# Patient Record
Sex: Female | Born: 1970 | Race: White | Hispanic: No | Marital: Single | Smoking: Never smoker
Health system: Southern US, Community
[De-identification: ages and names within clinical notes are randomized; demographics above are authoritative.]

## PROBLEM LIST (undated history)

## (undated) DIAGNOSIS — I491 Atrial premature depolarization: Secondary | ICD-10-CM

## (undated) DIAGNOSIS — K829 Disease of gallbladder, unspecified: Secondary | ICD-10-CM

## (undated) DIAGNOSIS — M1990 Unspecified osteoarthritis, unspecified site: Secondary | ICD-10-CM

## (undated) DIAGNOSIS — R7303 Prediabetes: Secondary | ICD-10-CM

## (undated) DIAGNOSIS — K219 Gastro-esophageal reflux disease without esophagitis: Secondary | ICD-10-CM

## (undated) DIAGNOSIS — M0520 Rheumatoid vasculitis with rheumatoid arthritis of unspecified site: Secondary | ICD-10-CM

## (undated) DIAGNOSIS — R002 Palpitations: Secondary | ICD-10-CM

## (undated) DIAGNOSIS — M2550 Pain in unspecified joint: Secondary | ICD-10-CM

## (undated) DIAGNOSIS — R600 Localized edema: Secondary | ICD-10-CM

## (undated) DIAGNOSIS — M069 Rheumatoid arthritis, unspecified: Secondary | ICD-10-CM

## (undated) DIAGNOSIS — R232 Flushing: Secondary | ICD-10-CM

## (undated) HISTORY — PX: OVARIAN CYST DRAINAGE: SHX325

## (undated) HISTORY — PX: BASAL CELL CARCINOMA EXCISION: SHX1214

## (undated) HISTORY — DX: Pain in unspecified joint: M25.50

## (undated) HISTORY — DX: Disease of gallbladder, unspecified: K82.9

## (undated) HISTORY — DX: Atrial premature depolarization: I49.1

## (undated) HISTORY — PX: CHOLECYSTECTOMY: SHX55

## (undated) HISTORY — DX: Unspecified osteoarthritis, unspecified site: M19.90

## (undated) HISTORY — DX: Rheumatoid arthritis, unspecified: M06.9

## (undated) HISTORY — DX: Gastro-esophageal reflux disease without esophagitis: K21.9

## (undated) HISTORY — DX: Palpitations: R00.2

## (undated) HISTORY — DX: Flushing: R23.2

## (undated) HISTORY — PX: SPINE SURGERY: SHX786

## (undated) HISTORY — DX: Localized edema: R60.0

## (undated) HISTORY — DX: Prediabetes: R73.03

---

## 2008-07-11 LAB — HM PAP SMEAR: HM Pap smear: NEGATIVE

## 2008-10-01 ENCOUNTER — Ambulatory Visit: Payer: Self-pay | Admitting: Unknown Physician Specialty

## 2014-11-01 DIAGNOSIS — R7303 Prediabetes: Secondary | ICD-10-CM

## 2014-11-01 HISTORY — PX: CERVICAL DISC SURGERY: SHX588

## 2014-11-01 HISTORY — DX: Prediabetes: R73.03

## 2015-09-11 ENCOUNTER — Other Ambulatory Visit: Payer: Self-pay

## 2015-09-11 DIAGNOSIS — Z299 Encounter for prophylactic measures, unspecified: Secondary | ICD-10-CM

## 2015-09-11 NOTE — Progress Notes (Signed)
Patient came in to have blood drawn per Althea GrimmerAlicia Copland, PA-C at [facility].  Pt wants final lab results faxed to [employer].

## 2015-09-12 LAB — COMPREHENSIVE METABOLIC PANEL
A/G RATIO: 1.6 (ref 1.1–2.5)
ALT: 21 IU/L (ref 0–32)
AST: 17 IU/L (ref 0–40)
Albumin: 4.4 g/dL (ref 3.5–5.5)
Alkaline Phosphatase: 78 IU/L (ref 39–117)
BILIRUBIN TOTAL: 0.3 mg/dL (ref 0.0–1.2)
BUN / CREAT RATIO: 14 (ref 9–23)
BUN: 12 mg/dL (ref 6–24)
CALCIUM: 9.8 mg/dL (ref 8.7–10.2)
CHLORIDE: 102 mmol/L (ref 97–106)
CO2: 23 mmol/L (ref 18–29)
Creatinine, Ser: 0.85 mg/dL (ref 0.57–1.00)
GFR, EST AFRICAN AMERICAN: 95 mL/min/{1.73_m2} (ref >59)
GFR, EST NON AFRICAN AMERICAN: 82 mL/min/{1.73_m2} (ref >59)
Globulin, Total: 2.7 g/dL (ref 1.5–4.5)
Glucose: 102 mg/dL — ABNORMAL HIGH (ref 65–99)
POTASSIUM: 4.5 mmol/L (ref 3.5–5.2)
Sodium: 140 mmol/L (ref 136–144)
Total Protein: 7.1 g/dL (ref 6.0–8.5)

## 2015-09-12 LAB — HGB A1C W/O EAG: Hgb A1c MFr Bld: 5.8 % — ABNORMAL HIGH (ref 4.8–5.6)

## 2015-09-15 ENCOUNTER — Encounter: Payer: Self-pay | Admitting: Emergency Medicine

## 2016-09-06 ENCOUNTER — Emergency Department
Admission: EM | Admit: 2016-09-06 | Discharge: 2016-09-07 | Disposition: A | Discharge status: Home / Self Care | Payer: Worker's Compensation | Attending: Emergency Medicine | Admitting: Emergency Medicine

## 2016-09-06 DIAGNOSIS — Y990 Civilian activity done for income or pay: Secondary | ICD-10-CM | POA: Diagnosis not present

## 2016-09-06 DIAGNOSIS — X500XXA Overexertion from strenuous movement or load, initial encounter: Secondary | ICD-10-CM | POA: Diagnosis not present

## 2016-09-06 DIAGNOSIS — Y929 Unspecified place or not applicable: Secondary | ICD-10-CM | POA: Diagnosis not present

## 2016-09-06 DIAGNOSIS — S8992XA Unspecified injury of left lower leg, initial encounter: Secondary | ICD-10-CM | POA: Diagnosis present

## 2016-09-06 DIAGNOSIS — S86112A Strain of other muscle(s) and tendon(s) of posterior muscle group at lower leg level, left leg, initial encounter: Secondary | ICD-10-CM | POA: Diagnosis not present

## 2016-09-06 DIAGNOSIS — Y9389 Activity, other specified: Secondary | ICD-10-CM | POA: Insufficient documentation

## 2016-09-06 NOTE — ED Triage Notes (Signed)
[facility], was Civil engineer, contractingpushing stretcher up a hill, heard something pop in L calf. Pt came up limping.

## 2016-09-06 NOTE — ED Notes (Signed)
Workman's Comp profile indicated UDS upon request. 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 Ineligibility criteria paperwork filled out.

## 2016-09-07 MED ORDER — IBUPROFEN 600 MG PO TABS
600.0000 mg | ORAL_TABLET | ORAL | Status: AC
  Administered 2016-09-07: 600 mg via ORAL
  Filled 2016-09-07: qty 1

## 2016-09-07 MED ORDER — HYDROCODONE-ACETAMINOPHEN 5-325 MG PO TABS: 1.0000 | ORAL_TABLET | Freq: Four times a day (QID) | ORAL | 0 refills | Status: DC | PRN

## 2016-09-07 NOTE — Discharge Instructions (Signed)
Please follow up closely with employee health through [facility] within 1-2 days, and also call to set up follow-up with Dr. Hyacinth MeekerMiller this week. I suspect you have a calf muscle tear, however he will need further evaluation to assure he do not also have an Achilles tendon injury. You may require an MRI or other evaluation to accurately diagnose the cause of today's injury.  Please utilize ACE wrap and crutches. Do no weight bearing on the left lower leg until cleared and followed-up with Dr. Hyacinth MeekerMiller.  Return to the emergency room right away if you develop severe pain, numbness tingling weakness or a cold blue or weak foot or other new concerns arise.

## 2016-09-07 NOTE — ED Provider Notes (Signed)
[facility] Emergency Department Provider Note   ____________________________________________   First MD Initiated Contact with Patient 09/06/16 2323     (approximate)  I have reviewed the triage vital signs and the nursing notes.   HISTORY  Chief Complaint Left calf pain   HPI Carly Stanley is a 45 y.o. female who was working pushing a Doctor, general practice up a steep embankment when she felt a sudden severe pain in the left medial portion of her calf. Since that time she has only been able to bear slight amount await, and has significant pain with any movement of the foot especially, as she demonstrates dorsiflexing it.  No pain at the base of the left foot, in the toes, or at the ankle, pain rest medially along the mid calf. Severe when attempting to bear weight.  No nausea vomiting or fever.  Reports healthy, previous history of a cervical surgery in the distant past  No past medical history on file.  There are no active problems to display for this patient.   No past surgical history on file.  Prior to Admission medications   Medication Sig Start Date End Date Taking? Authorizing Provider  HYDROcodone-acetaminophen (NORCO/VICODIN) 5-325 MG tablet Take 1 tablet by mouth every 6 (six) hours as needed for moderate pain or severe pain. 09/07/16   Sharyn Creamer, MD    Allergies Patient has no known allergies.  No family history on file.  Social History Social History  Substance Use Topics  . Smoking status: Not on file  . Smokeless tobacco: Not on file  . Alcohol use Not on file    Review of Systems  There was no fall. No trauma aside from sudden onset while pushing stretcher area and no pain in the knee or thigh  Cardiovascular: Denies chest pain. Respiratory: Denies shortness of breath. Gastrointestinal: No abdominal pain.  No nausea, no vomiting.  No diarrhea.  No constipation. Musculoskeletal: Negative for back pain. See history of present  illness Skin: Negative for rash. Neurological: Negative for headaches, focal weakness or numbness.   ____________________________________________   PHYSICAL EXAM:  VITAL SIGNS: ED Triage Vitals  Enc Vitals Group     BP 09/06/16 2335 117/79     Pulse Rate 09/06/16 2335 80     Resp 09/06/16 2335 18     Temp 09/06/16 2335 98.3 F (36.8 C)     Temp Source 09/06/16 2335 Oral     SpO2 09/06/16 2335 97 %     Weight --      Height --      Head Circumference --      Peak Flow --      Pain Score 09/06/16 2342 5     Pain Loc --      Pain Edu? --      Excl. in GC? --     Constitutional: Alert and oriented. Well appearing and in no acute distressResting with her left leg elevated. Eyes: Conjunctivae are normal. PERRL. EOMI. Head: Atraumatic. Nose: No congestion/rhinnorhea. Mouth/Throat: Mucous membranes are moist.  Cardiovascular: Normal rate, regular rhythm. Strong dorsalis pedis and posterior tibial pulse on the left Respiratory: Normal respiratory effort.  No retractions. Speaks in full clear sentences Gastrointestinal: No distention.  Musculoskeletal: Lower Extremities  No edema. Normal DP/PT pulses bilateral with good cap refill.  Normal neuro-motor function lower extremities bilateral.  RIGHT Right lower extremity demonstrates normal strength, good use of all muscles. No edema bruising or contusions of the right hip, right  knee, right ankle. Full range of motion of the right lower extremity without pain. No pain on axial loading. No evidence of trauma.  LEFT Left lower extremity demonstrates normal strength except for limitation especially with dorsi and plantar flexion at the foot due to pain in the medial left gastrocnemius, good use of all muscles. No edema bruising or contusions of the hip,  knee, ankle though there is slight edema and tenderness over the left medial gastrocnemius.  No pain on axial loading. No evidence of deformity. Unable to perform [facility] test due  to pain, but there is no tenderness at the insertion of the Achilles or the distal third of the posterior left leg. Patient is able to dorsi and plantarflex at the foot, but dorsiflexing in particular causes pain over the medial left calf. Normal capillary refill and sensation over all digits of the foot.  Neurologic:  Normal speech and language. No gross focal neurologic deficits are appreciated.  Skin:  Skin is warm, dry and intact. No rash noted. Psychiatric: Mood and affect are normal. Speech and behavior are normal.  ____________________________________________   LABS (all labs ordered are listed, but only abnormal results are displayed)  Labs Reviewed - No data to display ____________________________________________  EKG   ____________________________________________  RADIOLOGY   ____________________________________________   PROCEDURES  Procedure(s) performed: None  Procedures  Critical Care performed: No  ____________________________________________   INITIAL IMPRESSION / ASSESSMENT AND PLAN / ED COURSE  Pertinent labs & imaging results that were available during my care of the patient were reviewed by me and considered in my medical decision making (see chart for details).  Sudden onset left thigh pain under loading. Pain isolates primarily to the medial left calf, and clinical exam appears most consistent with likely gastrocnemius tear or potentially Achilles type injury. Neurovascular intact. Clinical exam and history do not fit that of an infectious etiology or that of a acute thrombotic or embolic disease. Discussed with the patient, we will treat her conservatively with crutches, nonweightbearing, Ace wrap," rice" therapy. She has access to and has seen Dr. Hyacinth MeekerMiller in the past and will follow-up closely with orthopedics. Patient understands that further workup likely involves obtaining an MRI, close follow-up with orthopedics this week for further diagnostic  testing as I cannot exclude muscle body rupture, complete tear, or Achilles injury at this time  I will prescribe the patient a narcotic pain (hydorocodone) medicine due to their condition which I anticipate will cause at least moderate pain short term. I discussed with the patient safe use of narcotic pain medicines, and that they are not to drive, work in dangerous areas, or ever take more than prescribed (no more than 1 pill every 6 hours). We discussed that this is the type of medication that can be  overdosed on and the risks of this type of medicine. Patient is very agreeable to only use as prescribed and to never use more than prescribed.   Clinical Course      ____________________________________________   FINAL CLINICAL IMPRESSION(S) / ED DIAGNOSES  Final diagnoses:  Gastrocnemius tear, left, initial encounter      NEW MEDICATIONS STARTED DURING THIS VISIT:  Discharge Medication List as of 09/07/2016 12:35 AM    START taking these medications   Details  HYDROcodone-acetaminophen (NORCO/VICODIN) 5-325 MG tablet Take 1 tablet by mouth every 6 (six) hours as needed for moderate pain or severe pain., Starting Tue 09/07/2016, Print         Note:  This document  was prepared using Conservation officer, historic buildingsDragon voice recognition software and may include unintentional dictation errors.     Sharyn CreamerMark Vertis Scheib, MD 09/07/16 [phone]

## 2016-09-20 ENCOUNTER — Other Ambulatory Visit: Payer: Self-pay

## 2016-09-20 DIAGNOSIS — R7309 Other abnormal glucose: Secondary | ICD-10-CM

## 2016-09-20 DIAGNOSIS — Z0000 Encounter for general adult medical examination without abnormal findings: Secondary | ICD-10-CM

## 2016-09-20 NOTE — Progress Notes (Signed)
Patient in office today for lab work only per [facility] drawn by Toniann FailWendy

## 2016-09-21 LAB — LIPID PANEL
CHOLESTEROL TOTAL: 157 mg/dL (ref 100–199)
Chol/HDL Ratio: 2.5 ratio units (ref 0.0–4.4)
HDL: 63 mg/dL (ref >39)
LDL Calculated: 83 mg/dL (ref 0–99)
Triglycerides: 54 mg/dL (ref 0–149)
VLDL CHOLESTEROL CAL: 11 mg/dL (ref 5–40)

## 2016-09-21 LAB — COMPREHENSIVE METABOLIC PANEL
ALBUMIN: 4.7 g/dL (ref 3.5–5.5)
ALT: 20 IU/L (ref 0–32)
AST: 20 IU/L (ref 0–40)
Albumin/Globulin Ratio: 1.6 (ref 1.2–2.2)
Alkaline Phosphatase: 84 IU/L (ref 39–117)
BUN / CREAT RATIO: 15 (ref 9–23)
BUN: 13 mg/dL (ref 6–24)
Bilirubin Total: 0.3 mg/dL (ref 0.0–1.2)
CALCIUM: 10 mg/dL (ref 8.7–10.2)
CO2: 18 mmol/L (ref 18–29)
CREATININE: 0.88 mg/dL (ref 0.57–1.00)
Chloride: 100 mmol/L (ref 96–106)
GFR, EST AFRICAN AMERICAN: 90 mL/min/{1.73_m2} (ref >59)
GFR, EST NON AFRICAN AMERICAN: 78 mL/min/{1.73_m2} (ref >59)
GLOBULIN, TOTAL: 3 g/dL (ref 1.5–4.5)
Glucose: 92 mg/dL (ref 65–99)
Potassium: 5.2 mmol/L (ref 3.5–5.2)
SODIUM: 139 mmol/L (ref 134–144)
TOTAL PROTEIN: 7.7 g/dL (ref 6.0–8.5)

## 2016-09-21 LAB — VITAMIN D 25 HYDROXY (VIT D DEFICIENCY, FRACTURES): VIT D 25 HYDROXY: 36.6 ng/mL (ref 30.0–100.0)

## 2016-09-21 LAB — HGB A1C W/O EAG: Hgb A1c MFr Bld: 5.5 % (ref 4.8–5.6)

## 2016-09-22 ENCOUNTER — Other Ambulatory Visit: Payer: Self-pay | Admitting: Obstetrics and Gynecology

## 2016-09-22 DIAGNOSIS — Z1231 Encounter for screening mammogram for malignant neoplasm of breast: Secondary | ICD-10-CM

## 2016-10-06 ENCOUNTER — Ambulatory Visit
Admission: RE | Admit: 2016-10-06 | Discharge: 2016-10-06 | Disposition: A | Discharge status: Home / Self Care | Payer: Managed Care, Other (non HMO) | Source: Ambulatory Visit | Attending: Obstetrics and Gynecology | Admitting: Obstetrics and Gynecology

## 2016-10-06 DIAGNOSIS — Z1231 Encounter for screening mammogram for malignant neoplasm of breast: Secondary | ICD-10-CM | POA: Diagnosis not present

## 2016-10-12 ENCOUNTER — Other Ambulatory Visit: Payer: Self-pay | Admitting: *Deleted

## 2016-10-12 ENCOUNTER — Encounter: Payer: Self-pay | Admitting: Physician Assistant

## 2016-10-12 ENCOUNTER — Ambulatory Visit: Payer: Self-pay | Admitting: Physician Assistant

## 2016-10-12 ENCOUNTER — Inpatient Hospital Stay
Admission: RE | Admit: 2016-10-12 | Discharge: 2016-10-12 | Disposition: A | Discharge status: Home / Self Care | Payer: Self-pay | Source: Ambulatory Visit | Attending: *Deleted | Admitting: *Deleted

## 2016-10-12 VITALS — BP 120/70 | HR 96 | Temp 98.6°F

## 2016-10-12 DIAGNOSIS — Z9289 Personal history of other medical treatment: Secondary | ICD-10-CM

## 2016-10-12 DIAGNOSIS — J069 Acute upper respiratory infection, unspecified: Secondary | ICD-10-CM

## 2016-10-12 MED ORDER — AMOXICILLIN 875 MG PO TABS: 875.0000 mg | ORAL_TABLET | Freq: Two times a day (BID) | ORAL | 0 refills | Status: DC

## 2016-10-12 MED ORDER — HYDROCOD POLST-CPM POLST ER 10-8 MG/5ML PO SUER: 5.0000 mL | Freq: Two times a day (BID) | ORAL | 0 refills | Status: DC | PRN

## 2016-10-12 MED ORDER — METHYLPREDNISOLONE 4 MG PO TBPK: ORAL_TABLET | ORAL | 0 refills | Status: DC

## 2016-10-12 NOTE — Progress Notes (Signed)
S: C/o cough and congestion for 5 days, no fever, chills, cp/sob, v/d; mucus was green this am but clear throughout the day, cough is sporadic, keeping her awake at night, felt bad a couple of weeks ago, started getting better but now the sx are worse Using otc meds: robitussin  O: PE: vitals wnl, nad, perrl eomi, normocephalic, tms dull, nasal mucosa red and swollen, throat injected, neck supple no lymph, lungs c t a, cv rrr, neuro intact  A:  Acute uri   P: drink fluids, continue regular meds , use otc meds of choice, return if not improving in 5 days, return earlier if worsening, amoxil, tussionex, medrol dose pack

## 2017-01-12 ENCOUNTER — Other Ambulatory Visit: Payer: Self-pay

## 2017-01-12 VITALS — BP 130/90 | Ht 62.0 in | Wt 180.0 lb

## 2017-01-12 DIAGNOSIS — Z008 Encounter for other general examination: Secondary | ICD-10-CM

## 2017-01-12 DIAGNOSIS — Z0189 Encounter for other specified special examinations: Principal | ICD-10-CM

## 2017-01-12 NOTE — Progress Notes (Signed)
Patient came in to have her biometric screening done.  Patient expressed that she had her physical done with her own physician.

## 2017-09-09 ENCOUNTER — Telehealth: Payer: Self-pay

## 2017-09-09 NOTE — Telephone Encounter (Signed)
Pt has appt on the 28th, works for [employer] and [facility].  Would like order for blood work to be written and [facility] on [address]  [phone]

## 2017-09-12 NOTE — Telephone Encounter (Signed)
Pls fax Thx 

## 2017-09-28 ENCOUNTER — Ambulatory Visit (INDEPENDENT_AMBULATORY_CARE_PROVIDER_SITE_OTHER): Payer: Managed Care, Other (non HMO) | Admitting: Obstetrics and Gynecology

## 2017-09-28 ENCOUNTER — Encounter: Payer: Self-pay | Admitting: Obstetrics and Gynecology

## 2017-09-28 ENCOUNTER — Other Ambulatory Visit: Payer: Self-pay

## 2017-09-28 VITALS — BP 118/80 | HR 76 | Ht 62.0 in | Wt 164.0 lb

## 2017-09-28 DIAGNOSIS — N3946 Mixed incontinence: Secondary | ICD-10-CM

## 2017-09-28 DIAGNOSIS — Z1239 Encounter for other screening for malignant neoplasm of breast: Secondary | ICD-10-CM

## 2017-09-28 DIAGNOSIS — N3281 Overactive bladder: Secondary | ICD-10-CM

## 2017-09-28 DIAGNOSIS — Z131 Encounter for screening for diabetes mellitus: Secondary | ICD-10-CM | POA: Diagnosis not present

## 2017-09-28 DIAGNOSIS — Z01419 Encounter for gynecological examination (general) (routine) without abnormal findings: Secondary | ICD-10-CM

## 2017-09-28 DIAGNOSIS — Z13220 Encounter for screening for lipoid disorders: Secondary | ICD-10-CM | POA: Diagnosis not present

## 2017-09-28 DIAGNOSIS — Z0000 Encounter for general adult medical examination without abnormal findings: Secondary | ICD-10-CM | POA: Diagnosis not present

## 2017-09-28 DIAGNOSIS — Z1231 Encounter for screening mammogram for malignant neoplasm of breast: Secondary | ICD-10-CM | POA: Diagnosis not present

## 2017-09-28 NOTE — Progress Notes (Signed)
PCP:  Patient, No Pcp Per   Chief Complaint  Patient presents with  . Gynecologic Exam    No Complaints     HPI:      Ms. Carly Stanley is a 46 y.o. No obstetric history on file. who LMP was Patient's last menstrual period was 09/15/2017., presents today for her annual examination.  Her menses are regular every 28-30 days, lasting 5 days.  Dysmenorrhea mild. She does not have intermenstrual bleeding. She has menstrual HAs.  Sex activity: single partner, declines BC.  Last Pap: September 17, 2015  Results were: no abnormalities /neg HPV DNA  Hx of STDs: none  Last mammogram: October 06, 2016  Results were: normal--routine follow-up in 12 months There is a FH of breast cancer in her mom, genetic testing not indicated. There is no FH of ovarian cancer. The patient does do self-breast exams.  Tobacco use: The patient denies current or previous tobacco use. Alcohol use: none No drug use.  Exercise: not active  She does get adequate calcium and Vitamin D in her diet.  She had pre-DM on labs 2016 but normal last yr. Due for fasting labs for insurance benefits. 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  Pt has noted OAB/urge incont and SUI sx. She drinks caffeine daily.    Past Medical History:  Diagnosis Date  . Pre-diabetes 2016    Past Surgical History:  Procedure Laterality Date  . CERVICAL DISC SURGERY  2016  . CHOLECYSTECTOMY    . OVARIAN CYST DRAINAGE      Family History  Problem Relation Age of Onset  . Breast cancer Mother 6470  . Hyperlipidemia Mother   . Hypertension Mother   . Hypertension Father   . Diabetes Brother   . Cancer Maternal Grandmother        Sinus  . Lung cancer Paternal Grandfather [id number]    Social History   Socioeconomic History  . Marital status: Single    Spouse name: Not on file  . Number of children: Not on file  . Years of education: Not on file  . Highest education level: Not on file  Social Needs  . Financial resource strain: Not on  file  . Food insecurity - worry: Not on file  . Food insecurity - inability: Not on file  . Transportation needs - medical: Not on file  . Transportation needs - non-medical: Not on file  Occupational History  . Not on file  Tobacco Use  . Smoking status: Never Smoker  . Smokeless tobacco: Never Used  Substance and Sexual Activity  . Alcohol use: No    Frequency: Never  . Drug use: No  . Sexual activity: Yes  Other Topics Concern  . Not on file  Social History Narrative  . Not on file    No outpatient medications have been marked as taking for the 09/28/17 encounter (Office Visit) with Copland, Ilona SorrelAlicia B, PA-C.     ROS:  Review of Systems  Constitutional: Negative for fatigue, fever and unexpected weight change.  Respiratory: Negative for cough, shortness of breath and wheezing.   Cardiovascular: Negative for chest pain, palpitations and leg swelling.  Gastrointestinal: Negative for blood in stool, constipation, diarrhea, nausea and vomiting.  Endocrine: Negative for cold intolerance, heat intolerance and polyuria.  Genitourinary: Positive for urgency. Negative for dyspareunia, dysuria, flank pain, frequency, genital sores, hematuria, menstrual problem, pelvic pain, vaginal bleeding, vaginal discharge and vaginal pain.  Musculoskeletal: Negative for back pain, joint swelling and  myalgias.  Skin: Negative for rash.  Neurological: Negative for dizziness, syncope, light-headedness, numbness and headaches.  Hematological: Negative for adenopathy.  Psychiatric/Behavioral: Negative for agitation, confusion, sleep disturbance and suicidal ideas. The patient is not nervous/anxious.      Objective: BP 118/80 (BP Location: Left Arm, Patient Position: Sitting, Cuff Size: Normal)   Pulse 76   Ht  (1.575 m)   Wt 164 lb (74.4 kg)   LMP 09/15/2017   BMI 30.00 kg/m    Physical Exam  Constitutional: She is oriented to person, place, and time. She appears well-developed and  well-nourished.  Genitourinary: Vagina normal and uterus normal. There is no rash or tenderness on the right labia. There is no rash or tenderness on the left labia. No erythema or tenderness in the vagina. No vaginal discharge found. Right adnexum does not display mass and does not display tenderness. Left adnexum does not display mass and does not display tenderness. Cervix does not exhibit motion tenderness or polyp. Uterus is not enlarged or tender.  Neck: Normal range of motion. No thyromegaly present.  Cardiovascular: Normal rate, regular rhythm and normal heart sounds.  No murmur heard. Pulmonary/Chest: Effort normal and breath sounds normal. Right breast exhibits no mass, no nipple discharge, no skin change and no tenderness. Left breast exhibits no mass, no nipple discharge, no skin change and no tenderness.  Abdominal: Soft. There is no tenderness. There is no guarding.  Musculoskeletal: Normal range of motion.  Neurological: She is alert and oriented to person, place, and time. No cranial nerve deficit.  Psychiatric: She has a normal mood and affect. Her behavior is normal.  Vitals reviewed.   Assessment/Plan: Encounter for annual routine gynecological examination  Screening for breast cancer - Pt to sched mammo. - Plan: MM DIGITAL SCREENING BILATERAL  Blood tests for routine general physical examination - Will do at employee health. Will f/u wiht results.  - Plan: Comprehensive metabolic panel, Lipid panel, Hemoglobin A1c  Screening cholesterol level - Plan: Lipid panel  Screening for diabetes mellitus - Plan: Hemoglobin A1c  OAB (overactive bladder) - D/C caffeine/void frequently.  Mixed incontinence  GYN counsel breast self exam, mammography screening, adequate intake of calcium and vitamin D, diet and exercise     F/U  Return in about 1 year (around 09/28/2018).  Alicia B. Copland, PA-C 09/28/2017 2:40 PM

## 2017-09-28 NOTE — Patient Instructions (Signed)
I value your feedback and entrusting us with your care. If you get a [facility] patient survey, I would appreciate you taking the time to let us know about your experience today. Thank you! 

## 2017-10-14 ENCOUNTER — Other Ambulatory Visit: Payer: Worker's Compensation

## 2017-10-14 DIAGNOSIS — Z0000 Encounter for general adult medical examination without abnormal findings: Secondary | ICD-10-CM

## 2017-10-14 NOTE — Progress Notes (Signed)
Patient in office today for labwork only

## 2017-10-15 LAB — COMPREHENSIVE METABOLIC PANEL
A/G RATIO: 1.7 (ref 1.2–2.2)
ALBUMIN: 4.1 g/dL (ref 3.5–5.5)
ALK PHOS: 64 IU/L (ref 39–117)
ALT: 14 IU/L (ref 0–32)
AST: 15 IU/L (ref 0–40)
BILIRUBIN TOTAL: 0.3 mg/dL (ref 0.0–1.2)
BUN / CREAT RATIO: 12 (ref 9–23)
BUN: 11 mg/dL (ref 6–24)
CHLORIDE: 105 mmol/L (ref 96–106)
CO2: 23 mmol/L (ref 20–29)
CREATININE: 0.91 mg/dL (ref 0.57–1.00)
Calcium: 8.9 mg/dL (ref 8.7–10.2)
GFR calc Af Amer: 86 mL/min/{1.73_m2} (ref >59)
GFR calc non Af Amer: 75 mL/min/{1.73_m2} (ref >59)
GLOBULIN, TOTAL: 2.4 g/dL (ref 1.5–4.5)
Glucose: 78 mg/dL (ref 65–99)
POTASSIUM: 4.6 mmol/L (ref 3.5–5.2)
SODIUM: 142 mmol/L (ref 134–144)
Total Protein: 6.5 g/dL (ref 6.0–8.5)

## 2017-10-15 LAB — HGB A1C W/O EAG: Hgb A1c MFr Bld: 5.5 % (ref 4.8–5.6)

## 2017-10-15 LAB — LIPID PANEL
CHOLESTEROL TOTAL: 146 mg/dL (ref 100–199)
Chol/HDL Ratio: 2.2 ratio (ref 0.0–4.4)
HDL: 65 mg/dL (ref >39)
LDL Calculated: 74 mg/dL (ref 0–99)
TRIGLYCERIDES: 35 mg/dL (ref 0–149)
VLDL Cholesterol Cal: 7 mg/dL (ref 5–40)

## 2017-10-17 NOTE — Progress Notes (Signed)
Lab results were faxed to Althea GrimmerAlicia Copland at [facility] per patient's request.

## 2017-10-18 ENCOUNTER — Telehealth: Payer: Self-pay

## 2017-10-18 NOTE — Telephone Encounter (Signed)
-----   Message from Maple Hudsonichard L Gilbert Jr., MD sent at 10/17/2017  4:00 PM EST ----- Labs OK.

## 2017-10-18 NOTE — Telephone Encounter (Signed)
Lmtcb. Patient advised to check labs on mychart and call back with any questions or concerns. sd

## 2017-10-26 ENCOUNTER — Ambulatory Visit
Admission: RE | Admit: 2017-10-26 | Discharge: 2017-10-26 | Disposition: A | Discharge status: Home / Self Care | Payer: Managed Care, Other (non HMO) | Source: Ambulatory Visit | Attending: Obstetrics and Gynecology | Admitting: Obstetrics and Gynecology

## 2017-10-26 DIAGNOSIS — Z1231 Encounter for screening mammogram for malignant neoplasm of breast: Secondary | ICD-10-CM | POA: Insufficient documentation

## 2017-10-26 DIAGNOSIS — Z1239 Encounter for other screening for malignant neoplasm of breast: Secondary | ICD-10-CM

## 2017-10-27 ENCOUNTER — Encounter: Payer: Self-pay | Admitting: Obstetrics and Gynecology

## 2017-11-04 ENCOUNTER — Ambulatory Visit: Payer: Self-pay | Admitting: Emergency Medicine

## 2017-11-04 ENCOUNTER — Encounter: Payer: Self-pay | Admitting: Emergency Medicine

## 2017-11-04 VITALS — BP 124/90 | HR 74 | Temp 97.8°F

## 2017-11-04 DIAGNOSIS — J069 Acute upper respiratory infection, unspecified: Secondary | ICD-10-CM

## 2017-11-04 DIAGNOSIS — J0101 Acute recurrent maxillary sinusitis: Secondary | ICD-10-CM

## 2017-11-04 MED ORDER — AMOXICILLIN 875 MG PO TABS: 875.0000 mg | ORAL_TABLET | Freq: Two times a day (BID) | ORAL | 0 refills | Status: DC

## 2017-11-04 MED ORDER — FLUTICASONE PROPIONATE 50 MCG/ACT NA SUSP: 2.0000 | Freq: Every day | NASAL | 6 refills | Status: DC

## 2017-11-04 NOTE — Progress Notes (Signed)
Subjective. Patient enters with onset 1 week ago of head congestion and fullness in the ears and chills. She has not had any documented fever with this. She has tried over-the-counter Alka-Seltzer but does not feel any improvement.  Review of systems.  Patient in good health except for overactive bladder symptoms. She is a nonsmoker. Objective. Pupils equal reactive to light. TMs with fluid. Nose congested. Throat slightly red. Chest clear to both auscultation and percussion. Assessment. Patient with persistent upper respiratory infection now with signs of sinusitis. Plan. Flonase. Amoxicillin 875 twice a day.

## 2017-11-29 ENCOUNTER — Ambulatory Visit: Payer: Managed Care, Other (non HMO) | Admitting: Cardiovascular Disease

## 2017-11-29 ENCOUNTER — Encounter: Payer: Self-pay | Admitting: Cardiovascular Disease

## 2017-11-29 VITALS — BP 132/82 | HR 68 | Ht 62.0 in | Wt 175.0 lb

## 2017-11-29 DIAGNOSIS — I491 Atrial premature depolarization: Secondary | ICD-10-CM | POA: Diagnosis not present

## 2017-11-29 DIAGNOSIS — R0602 Shortness of breath: Secondary | ICD-10-CM | POA: Insufficient documentation

## 2017-11-29 DIAGNOSIS — R002 Palpitations: Secondary | ICD-10-CM

## 2017-11-29 MED ORDER — PROPRANOLOL HCL 20 MG PO TABS: 20.0000 mg | ORAL_TABLET | Freq: Three times a day (TID) | ORAL | 3 refills | Status: DC | PRN

## 2017-11-29 MED ORDER — NEBIVOLOL HCL 5 MG PO TABS: 5.0000 mg | ORAL_TABLET | Freq: Every day | ORAL | 3 refills | Status: DC

## 2017-11-29 NOTE — Progress Notes (Signed)
Cardiology Office Note  Date:  11/29/2017   ID:  Carly Stanley Borgen, DOB 02-22-1971, MRN [id number]  PCP:  Patient, No Pcp Per   Chief Complaint  Patient presents with  . Other    Patient c/o SOB and heart fluttering. Meds reviewed verbally with patient.     HPI:  Ms. Carly Stanley Petteway is a 47 year old woman with past medical history of Palpitations Prediabetes Who presents for evaluation of her abnormal heart rhythm  Reports that over the past 6 months or so she has had waxing waning palpitations Initially was more rare, becoming more frequent Now presenting daily Feels palpitations up into her throat  She was at work one day and put a monitor on Telemetry strip reviewed with her in the office today showing normal sinus rhythm with APCs in a bigeminal pattern  She continues to have similar symptoms, does not seem to change Sometimes seems to affect her breathing but not significant Does not wake her up at night But she does have symptoms into the evening as well as day Does not seem to be related to her activity level  EKG personally reviewed by myself on todays visit Shows normal sinus rhythm with rate 68 bpm APC noted  As above telemetry strip reviewed with her today showing APCs in a bigeminal pattern   PMH:   has a past medical history of Pre-diabetes (2016).  PSH:    Past Surgical History:  Procedure Laterality Date  . CERVICAL DISC SURGERY  2016  . CHOLECYSTECTOMY    . OVARIAN CYST DRAINAGE      Current Outpatient Medications  Medication Sig Dispense Refill  . nebivolol (BYSTOLIC) 5 MG tablet Take 1 tablet (5 mg total) by mouth daily. 90 tablet 3  . propranolol (INDERAL) 20 MG tablet Take 1 tablet (20 mg total) by mouth 3 (three) times daily as needed. 90 tablet 3   No current facility-administered medications for this visit.      Allergies:   Patient has no known allergies.   Social History:  The patient  reports that  has never smoked. she has never used  smokeless tobacco. She reports that she does not drink alcohol or use drugs.   Family History:   family history includes Breast cancer (age of onset: 3070) in her mother; Cancer in her maternal grandmother; Diabetes in her brother; Hyperlipidemia in her mother; Hypertension in her father and mother; Lung cancer (age of onset: 4865) in her paternal grandfather.    Review of Systems: Review of Systems  Constitutional: Negative.   Respiratory: Negative.   Cardiovascular: Positive for palpitations.  Gastrointestinal: Negative.   Musculoskeletal: Negative.   Neurological: Negative.   Psychiatric/Behavioral: Negative.   All other systems reviewed and are negative.    PHYSICAL EXAM: VS:  BP 132/82 (BP Location: Left Arm, Patient Position: Sitting, Cuff Size: Normal)   Pulse 68   Ht 5\' 2"  (1.575 m)   Wt 175 lb (79.4 kg)   BMI 32.01 kg/m  , BMI Body mass index is 32.01 kg/m. GEN: Well nourished, well developed, in no acute distress  HEENT: normal  Neck: no JVD, carotid bruits, or masses Cardiac: RRR; no murmurs, rubs, or gallops,no edema  Ectopy appreciated Respiratory:  clear to auscultation bilaterally, normal work of breathing GI: soft, nontender, nondistended, + BS MS: no deformity or atrophy  Skin: warm and dry, no rash Neuro:  Strength and sensation are intact Psych: euthymic mood, full affect    Recent Labs: 10/14/2017: ALT 14;  BUN 11; Creatinine, Ser 0.91; Potassium 4.6; Sodium 142    Lipid Panel Lab Results  Component Value Date   CHOL 146 10/14/2017   HDL 65 10/14/2017   LDLCALC 74 10/14/2017   TRIG 35 10/14/2017      Wt Readings from Last 3 Encounters:  11/29/17 175 lb (79.4 kg)  09/28/17 164 lb (74.4 kg)  01/12/17 180 lb (81.6 kg)       ASSESSMENT AND PLAN:  Shortness of breath - Plan: EKG 12-Lead Likely secondary to palpitations below, minimal symptoms Has not slow down her exercise tolerance Normal EKG otherwise with normal clinical exam, no further  workup at this time  Palpitations She performed a telemetry strip while at work.  Works for EMS This showed APCs in a bigeminal pattern Long discussion concerning mechanism, management She is becoming more symptomatic Denies any recent stressors, excess caffeine, cold medication etc. Less likely structural heart disease Recommend she try Bystolic 5 mg daily For breakthrough applications suggest she try propranolol 10 up to 20 mg as needed  Frequent PACs Seen on recent telemetry strip Likely contributing to her symptoms Even appreciated on exam today Management as above   Total encounter time more than 45 minutes  Greater than 50% was spent in counseling and coordination of care with the patient   Disposition:   F/U as needed   Orders Placed This Encounter  Procedures  . EKG 12-Lead     Signed, Dossie Arbour, M.D., Ph.D. 11/29/2017  [facility] [place], [place] [phone]

## 2017-11-29 NOTE — Patient Instructions (Addendum)
Medication Instructions:   Consider taking bystolic 2.5  Up to 10 mg daily as needed for PACs Premature atrial contractions 24 hour Medication Samples have been provided to the patient.  Drug name: Bystolic       Strength: 5 mg        Qty: 2 boxes  LOT: [id number]  Exp.Date: 12/20   Take propranolol as needed (4 to 6 hour) 10 mg up to 20 mg up to three times a day  Labwork:  No new labs needed  Testing/Procedures:  No further testing at this time   Follow-Up: It was a pleasure seeing you in the office today. Please call us if you have new issues that need to be addressed before your next appt.  [phone]  Your physician wants you to follow-up in:  As needed  If you need a refill on your cardiac medications before your next appointment, please call your pharmacy.

## 2017-12-06 ENCOUNTER — Ambulatory Visit: Payer: Self-pay | Admitting: Cardiovascular Disease

## 2018-05-16 ENCOUNTER — Ambulatory Visit: Payer: Managed Care, Other (non HMO) | Admitting: Physician Assistant

## 2018-05-16 ENCOUNTER — Encounter: Payer: Self-pay | Admitting: Physician Assistant

## 2018-05-16 VITALS — BP 112/80 | HR 71 | Temp 98.2°F | Resp 16 | Ht 62.0 in | Wt 183.0 lb

## 2018-05-16 DIAGNOSIS — Z6833 Body mass index (BMI) 33.0-33.9, adult: Secondary | ICD-10-CM | POA: Diagnosis not present

## 2018-05-16 DIAGNOSIS — R002 Palpitations: Secondary | ICD-10-CM | POA: Diagnosis not present

## 2018-05-16 DIAGNOSIS — I491 Atrial premature depolarization: Secondary | ICD-10-CM | POA: Diagnosis not present

## 2018-05-16 DIAGNOSIS — R0602 Shortness of breath: Secondary | ICD-10-CM | POA: Diagnosis not present

## 2018-05-16 NOTE — Progress Notes (Signed)
Patient: Carly Stanley, Female    DOB: 1971/09/09, 47 y.o.   MRN: [id number] Visit Date: 05/16/2018  Today's Provider: Margaretann Loveless, PA-C   Chief Complaint  Patient presents with  . New Patient (Initial Visit)   Subjective:    New Patient Carly Stanley is a 47 y.o. female who presents today as a new patient to establish care. She feels well. 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 She reports she is sleeping poorly. Possibly secondary to shift work.   Her PMH includes PAC's. This was being F/B Dr. Mariah Milling. She is taking propanolol 20 mg po qd and bystolic 5 mg po qd for. She feels this is worsening. She would like to discuss increasing dose of medications.  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 -----------------------------------------------------------------   Review of Systems  Constitutional: Negative.   HENT: Negative.   Eyes: Negative.   Respiratory: Negative.   Cardiovascular: Positive for palpitations. Negative for chest pain and leg swelling.  Gastrointestinal: Negative.   Endocrine: Negative.   Genitourinary: Negative.   Musculoskeletal: Negative.   Skin: Negative.   Allergic/Immunologic: Negative.   Neurological: Negative.   Hematological: Negative.   Psychiatric/Behavioral: Negative.     Social History      She  reports that she has never smoked. She has never used smokeless tobacco. She reports that she does not drink alcohol or use drugs.       Social History   Socioeconomic History  . Marital status: Single    Spouse name: Not on file  . Number of children: Not on file  . Years of education: Not on file  . Highest education level: Not on file  Occupational History  . Not on file  Social Needs  . Financial resource strain: Not on file  . Food insecurity:    Worry: Not on file    Inability: Not on file  . Transportation needs:    Medical: Not on file    Non-medical: Not on  file  Tobacco Use  . Smoking status: Never Smoker  . Smokeless tobacco: Never Used  Substance and Sexual Activity  . Alcohol use: No    Frequency: Never  . Drug use: No  . Sexual activity: Yes  Lifestyle  . Physical activity:    Days per week: 0 days    Minutes per session: 0 min  . Stress: Not at all  Relationships  . Social connections:    Talks on phone: More than three times a week    Gets together: More than three times a week    Attends religious service: More than 4 times per year    Active member of club or organization: No    Attends meetings of clubs or organizations: Never    Relationship status: Divorced  Other Topics Concern  . Not on file  Social History Narrative  . Not on file    Past Medical History:  Diagnosis Date  . PAC (premature atrial contraction)   . Pre-diabetes 2016     Patient Active Problem List   Diagnosis Date Noted  . Palpitations 11/29/2017  . Shortness of breath 11/29/2017  . PAC (premature atrial contraction) 11/29/2017  . OAB (overactive bladder) 09/28/2017  . Mixed incontinence 09/28/2017    Past Surgical History:  Procedure Laterality Date  . CERVICAL DISC SURGERY  2016  . CHOLECYSTECTOMY    . OVARIAN  CYST DRAINAGE      Family History        Family Status  Relation Name Status  . Mother  (Not Specified)  . Father  (Not Specified)  . Brother  (Not Specified)  . MGM  (Not Specified)  . PGF  (Not Specified)  . Son  (Not Specified)  . PGM  (Not Specified)        Her family history includes ADD / ADHD in her son; Anxiety disorder in her mother and son; Atrial fibrillation in her paternal grandmother; Breast cancer (age of onset: 4) in her mother; Cancer in her maternal grandmother; Diabetes in her brother; Heart attack in her paternal grandmother; Hyperlipidemia in her mother; Hypertension in her father, mother, and paternal grandmother; Lung cancer (age of onset: 9) in her paternal grandfather.      No Known  Allergies   Current Outpatient Medications:  .  nebivolol (BYSTOLIC) 5 MG tablet, Take 1 tablet (5 mg total) by mouth daily., Disp: 90 tablet, Rfl: 3 .  propranolol (INDERAL) 20 MG tablet, Take 1 tablet (20 mg total) by mouth 3 (three) times daily as needed., Disp: 90 tablet, Rfl: 3   Patient Care Team: Margaretann Loveless, PA-C as PCP - General (Family Medicine)      Objective:   Vitals: BP 112/80 (BP Location: Left Arm, Patient Position: Sitting, Cuff Size: Normal)   Pulse 71   Temp 98.2 F (36.8 C) (Oral)   Resp 16   Ht 5\' 2"  (1.575 m)   Wt 183 lb (83 kg)   LMP 05/15/2018   SpO2 98%   BMI 33.47 kg/m    Vitals:   05/16/18 1517  BP: 112/80  Pulse: 71  Resp: 16  Temp: 98.2 F (36.8 C)  TempSrc: Oral  SpO2: 98%  Weight: 183 lb (83 kg)  Height: 5\' 2"  (1.575 m)     Physical Exam  Constitutional: She is oriented to person, place, and time. She appears well-developed and well-nourished. No distress.  HENT:  Head: Normocephalic and atraumatic.  Right Ear: External ear normal.  Left Ear: External ear normal.  Nose: Nose normal.  Mouth/Throat: Oropharynx is clear and moist. No oropharyngeal exudate.  Eyes: Pupils are equal, round, and reactive to light. Conjunctivae and EOM are normal. Right eye exhibits no discharge. Left eye exhibits no discharge. No scleral icterus.  Neck: Normal range of motion. Neck supple. No JVD present. No tracheal deviation present. No thyromegaly present.  Cardiovascular: Normal rate, regular rhythm, normal heart sounds and intact distal pulses. Exam reveals no gallop and no friction rub.  No murmur heard. Pulmonary/Chest: Effort normal and breath sounds normal. No respiratory distress. She has no wheezes. She has no rales. She exhibits no tenderness.  Abdominal: Soft. Bowel sounds are normal. She exhibits no distension and no mass. There is no tenderness. There is no rebound and no guarding.  Musculoskeletal: Normal range of motion. She  exhibits no edema or tenderness.  Lymphadenopathy:    She has no cervical adenopathy.  Neurological: She is alert and oriented to person, place, and time.  Skin: Skin is warm and dry. No rash noted. She is not diaphoretic.  Psychiatric: She has a normal mood and affect. Her behavior is normal. Judgment and thought content normal.  Vitals reviewed.    Depression Screen PHQ 2/9 Scores 05/16/2018  PHQ - 2 Score 0      Assessment & Plan:     Routine Health Maintenance and Physical Exam  Exercise Activities and Dietary recommendations Goals    None       There is no immunization history on file for this patient.  Health Maintenance  Topic Date Due  . HIV Screening  11/22/1983  . TETANUS/TDAP  11/22/1987  . INFLUENZA VACCINE  06/01/2018  . PAP SMEAR  09/16/2018     Discussed health benefits of physical activity, and encouraged her to engage in regular exercise appropriate for her age and condition.    1. PAC (premature atrial contraction) Worsening. Will try to slowly increase bystolic (go to 7.5mg  then to 10mg  if needed). Use propanolol 20mg  prn for acute exacerbations throughout the day. If symptoms continue to worsen or not improve may need to f/u with Dr. Mariah MillingGollan for further evaluation. I will see her back in November 2019 for CPE. Call if acute issue in the meantime.  2. Palpitations See above medical treatment plan.  3. Shortness of breath See above medical treatment plan.  4. BMI 33.0-33.9,adult Counseled patient on healthy lifestyle modifications including dieting and exercise.   --------------------------------------------------------------------    Margaretann LovelessJennifer M Laporshia Hogen, PA-C  [facility]

## 2018-05-30 ENCOUNTER — Encounter: Payer: Self-pay | Admitting: Physician Assistant

## 2018-06-23 ENCOUNTER — Ambulatory Visit: Payer: Managed Care, Other (non HMO) | Admitting: Physician Assistant

## 2018-06-23 ENCOUNTER — Encounter: Payer: Self-pay | Admitting: Physician Assistant

## 2018-06-23 VITALS — BP 140/88 | HR 68 | Temp 98.3°F | Resp 16 | Wt 187.2 lb

## 2018-06-23 DIAGNOSIS — H539 Unspecified visual disturbance: Secondary | ICD-10-CM

## 2018-06-23 DIAGNOSIS — R200 Anesthesia of skin: Secondary | ICD-10-CM

## 2018-06-23 DIAGNOSIS — R202 Paresthesia of skin: Secondary | ICD-10-CM

## 2018-06-23 DIAGNOSIS — M546 Pain in thoracic spine: Secondary | ICD-10-CM | POA: Diagnosis not present

## 2018-06-23 DIAGNOSIS — M6281 Muscle weakness (generalized): Secondary | ICD-10-CM | POA: Diagnosis not present

## 2018-06-23 MED ORDER — CYCLOBENZAPRINE HCL 5 MG PO TABS: 5.0000 mg | ORAL_TABLET | Freq: Three times a day (TID) | ORAL | 1 refills | Status: DC | PRN

## 2018-06-23 NOTE — Progress Notes (Signed)
Patient: Carly Stanley Female    DOB: Feb 20, 1971   47 y.o.   MRN: [id number] Visit Date: 06/23/2018  Today's Provider: Margaretann Loveless, PA-C   Chief Complaint  Patient presents with  . Back Pain   Subjective:    Back Pain  This is a new problem. The current episode started 1 to 4 weeks ago. The problem occurs every several days. The problem has been gradually worsening since onset. Pain location: Left shoulder blade area. The quality of the pain is described as aching and stabbing. Radiates to: Left breast with numbness. The pain is at a severity of 6/10 (today 7-8/10 since 2 am). The pain is moderate. The pain is the same all the time. Associated symptoms include numbness (left breast), tingling ("on her face" more on the left side for a month off and on in her jaw area.) and weakness. Pertinent negatives include no chest pain or fever. (She reports that today she has some tingling on her left arm and leg) She has tried NSAIDs (she reports that on Wednesday afternoon she went to her therapist and had massage and IBU. (she reports that she had a neck fusion about 4 yrs ago.)) for the symptoms. The treatment provided no relief.  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  She has also had an episode where her left arm was completely numb and unable to move her arm. This was very short timed and lasted approximately 5-10 minutes. She has since had intermitted numbness and tingling through the left arm, back and breast. Today her left breast is completely numb. She also reports having left sided facial numbness and tingling, stating it feels like right after going to the dentist and being numbed. Also has had visual changes. Some intermittent blurred vision and occasional "lightning bolts" shooting across her vision. Was told those were ocular, silent, migraines as she never has associated headache.     No Known  Allergies   Current Outpatient Medications:  .  nebivolol (BYSTOLIC) 5 MG tablet, Take 1 tablet (5 mg total) by mouth daily., Disp: 90 tablet, Rfl: 3 .  propranolol (INDERAL) 20 MG tablet, Take 1 tablet (20 mg total) by mouth 3 (three) times daily as needed., Disp: 90 tablet, Rfl: 3  Review of Systems  Constitutional: Negative for fever.  HENT: Negative.   Eyes: Positive for visual disturbance.  Respiratory: Negative.   Cardiovascular: Negative for chest pain.  Gastrointestinal: Negative.   Musculoskeletal: Positive for back pain.  Neurological: Positive for tingling ("on her face" more on the left side for a month off and on in her jaw area.), weakness and numbness (left breast).  Psychiatric/Behavioral: Negative.     Social History   Tobacco Use  . Smoking status: Never Smoker  . Smokeless tobacco: Never Used  Substance Use Topics  . Alcohol use: No    Frequency: Never   Objective:   BP 140/88 (BP Location: Left Arm, Patient Position: Sitting, Cuff Size: Normal)   Pulse 68   Temp 98.3 F (36.8 C) (Oral)   Resp 16   Wt 187 lb 3.2 oz (84.9 kg)   LMP 06/02/2018   BMI 34.24 kg/m  Vitals:   06/23/18 1032  BP: 140/88  Pulse: 68  Resp: 16  Temp: 98.3 F (36.8 C)  TempSrc: Oral  Weight: 187 lb 3.2 oz (84.9 kg)     Physical  Exam  Constitutional: She is oriented to person, place, and time. She appears well-developed and well-nourished. No distress.  Neck: Normal range of motion. Neck supple.  Cardiovascular: Normal rate, regular rhythm and normal heart sounds. Exam reveals no gallop and no friction rub.  No murmur heard. Pulmonary/Chest: Effort normal and breath sounds normal. No respiratory distress. She has no wheezes. She has no rales.  Neurological: She is alert and oriented to person, place, and time. She has normal strength. A sensory deficit (decreased left jaw line vs right, and left breast) is present. No cranial nerve deficit. She displays a negative Romberg  sign. Coordination and gait normal.  Skin: She is not diaphoretic.  Vitals reviewed.      Assessment & Plan:     1. Muscle weakness (generalized) Concerned for mass vs MS. Will get CT scan as below for further imaging to r/o brain mass or other abnormality. Continue flexeril for muscle tension in neck to help sleep. I will f/u pending results of the CT scan. Will refer pending results. Call if symptoms worsen in the meantime prior to CT scan.  - CT Head W Contrast; Future - cyclobenzaprine (FLEXERIL) 5 MG tablet; Take 1 tablet (5 mg total) by mouth 3 (three) times daily as needed for muscle spasms.  Dispense: 30 tablet; Refill: 1  2. Acute left-sided thoracic back pain See above medical treatment plan. - cyclobenzaprine (FLEXERIL) 5 MG tablet; Take 1 tablet (5 mg total) by mouth 3 (three) times daily as needed for muscle spasms.  Dispense: 30 tablet; Refill: 1  3. Numbness and tingling See above medical treatment plan. - cyclobenzaprine (FLEXERIL) 5 MG tablet; Take 1 tablet (5 mg total) by mouth 3 (three) times daily as needed for muscle spasms.  Dispense: 30 tablet; Refill: 1  4. Visual disturbance See above medical treatment plan. - cyclobenzaprine (FLEXERIL) 5 MG tablet; Take 1 tablet (5 mg total) by mouth 3 (three) times daily as needed for muscle spasms.  Dispense: 30 tablet; Refill: 1       Margaretann LovelessJennifer M Walton Digilio, PA-C  [facility]

## 2018-06-24 ENCOUNTER — Encounter: Payer: Self-pay | Admitting: Physician Assistant

## 2018-06-24 ENCOUNTER — Telehealth: Payer: Self-pay | Admitting: Physician Assistant

## 2018-06-24 DIAGNOSIS — B029 Zoster without complications: Secondary | ICD-10-CM

## 2018-06-24 MED ORDER — VALACYCLOVIR HCL 1 G PO TABS: 1000.0000 mg | ORAL_TABLET | Freq: Three times a day (TID) | ORAL | 0 refills | Status: DC

## 2018-06-24 NOTE — Telephone Encounter (Signed)
Pt stated she sent a MyChart message to Carly Stanley because after her OV yesterday 06/23/18 pt now has a rash near breast area. Pt stated she included a picture in the message. Pt stated that she is in a lot of pain and discussion of shingles came up yesterday. Pt is requesting a call back. Please advise. Thanks TNP

## 2018-06-24 NOTE — Telephone Encounter (Signed)
Have you seen and reviewed my chart message? Please advise. KW

## 2018-06-24 NOTE — Telephone Encounter (Signed)
Sent in valtrex for shingles

## 2018-06-25 ENCOUNTER — Encounter: Payer: Self-pay | Admitting: Physician Assistant

## 2018-06-25 DIAGNOSIS — B029 Zoster without complications: Secondary | ICD-10-CM

## 2018-06-26 MED ORDER — HYDROCODONE-ACETAMINOPHEN 5-325 MG PO TABS: 1.0000 | ORAL_TABLET | ORAL | 0 refills | Status: DC | PRN

## 2018-06-26 NOTE — Telephone Encounter (Signed)
Pt called this morning saying the shingles rash has gotten worse and she has been in pain all weekend.  She wants to know if there is anything else she can take and if she can take something for pain  [facility]  CB#  [phone]

## 2018-06-27 ENCOUNTER — Encounter: Payer: Self-pay | Admitting: Physician Assistant

## 2018-06-27 ENCOUNTER — Telehealth: Payer: Self-pay | Admitting: Physician Assistant

## 2018-06-27 DIAGNOSIS — B0229 Other postherpetic nervous system involvement: Secondary | ICD-10-CM

## 2018-06-27 MED ORDER — GABAPENTIN 100 MG PO CAPS: 100.0000 mg | ORAL_CAPSULE | Freq: Three times a day (TID) | ORAL | 0 refills | Status: DC

## 2018-06-27 NOTE — Telephone Encounter (Signed)
Per insurance company they will approve MRI of brain w/wo contrast which they state is best test for rule out MS.If you still would like to get CT preformed they will need to speak peer to peer at [phone]

## 2018-06-27 NOTE — Addendum Note (Signed)
Addended by: Margaretann LovelessBURNETTE, [place] M on: 06/27/2018 12:59 PM   Modules accepted: Orders

## 2018-06-27 NOTE — Telephone Encounter (Signed)
Called pt to scheduled her MRI and she states she now has shingles and feels her symptoms are caused by that.She does not want to do MRI at this time

## 2018-06-27 NOTE — Telephone Encounter (Signed)
Noted  

## 2018-06-27 NOTE — Telephone Encounter (Signed)
I will change order.

## 2018-07-17 ENCOUNTER — Telehealth: Payer: Self-pay | Admitting: Physician Assistant

## 2018-07-17 ENCOUNTER — Encounter: Payer: Self-pay | Admitting: Physician Assistant

## 2018-07-17 NOTE — Telephone Encounter (Signed)
Carly Stanley said that she had talked to you about taking her son on as a new patient and you said yes.   However, he needs to be seen on Friday the 20th for anxiety. He is a Archivistcollege student and that's the only day he will be here.

## 2018-07-17 NOTE — Telephone Encounter (Signed)
Please Review

## 2018-07-17 NOTE — Telephone Encounter (Signed)
Advised as directed below.Patient's mother is going to call to schedule for another Friday.

## 2018-07-17 NOTE — Telephone Encounter (Signed)
I have an 11 o clock or 1:20 or 3:40 all on 07/20/18 that we could overbook if he could be seen that day. But Friday I have no 40 min slot to put him in.

## 2018-07-17 NOTE — Telephone Encounter (Signed)
Unfortunately as of right now I have no availabilities that day.

## 2018-07-31 ENCOUNTER — Encounter: Payer: Self-pay | Admitting: Physician Assistant

## 2018-07-31 DIAGNOSIS — B0229 Other postherpetic nervous system involvement: Secondary | ICD-10-CM

## 2018-07-31 MED ORDER — GABAPENTIN 100 MG PO CAPS: 100.0000 mg | ORAL_CAPSULE | Freq: Three times a day (TID) | ORAL | 0 refills | Status: DC

## 2018-09-21 ENCOUNTER — Encounter: Payer: Managed Care, Other (non HMO) | Admitting: Physician Assistant

## 2018-09-22 ENCOUNTER — Encounter: Payer: Self-pay | Admitting: Physician Assistant

## 2018-09-22 DIAGNOSIS — Z0189 Encounter for other specified special examinations: Principal | ICD-10-CM

## 2018-09-22 DIAGNOSIS — Z008 Encounter for other general examination: Secondary | ICD-10-CM

## 2018-09-25 ENCOUNTER — Other Ambulatory Visit: Payer: Self-pay

## 2018-09-25 DIAGNOSIS — Z0000 Encounter for general adult medical examination without abnormal findings: Secondary | ICD-10-CM

## 2018-09-26 ENCOUNTER — Telehealth: Payer: Self-pay

## 2018-09-26 LAB — CBC WITH DIFFERENTIAL/PLATELET
Basophils Absolute: 0.1 10*3/uL (ref 0.0–0.2)
Basos: 1 %
EOS (ABSOLUTE): 0.2 10*3/uL (ref 0.0–0.4)
EOS: 3 %
HEMATOCRIT: 39.1 % (ref 34.0–46.6)
HEMOGLOBIN: 13.4 g/dL (ref 11.1–15.9)
Immature Grans (Abs): 0 10*3/uL (ref 0.0–0.1)
Immature Granulocytes: 0 %
LYMPHS ABS: 1.6 10*3/uL (ref 0.7–3.1)
Lymphs: 28 %
MCH: 30.2 pg (ref 26.6–33.0)
MCHC: 34.3 g/dL (ref 31.5–35.7)
MCV: 88 fL (ref 79–97)
MONOCYTES: 8 %
Monocytes Absolute: 0.5 10*3/uL (ref 0.1–0.9)
NEUTROS ABS: 3.3 10*3/uL (ref 1.4–7.0)
Neutrophils: 60 %
Platelets: 343 10*3/uL (ref 150–450)
RBC: 4.43 x10E6/uL (ref 3.77–5.28)
RDW: 12.4 % (ref 12.3–15.4)
WBC: 5.6 10*3/uL (ref 3.4–10.8)

## 2018-09-26 LAB — COMPREHENSIVE METABOLIC PANEL
ALBUMIN: 4.6 g/dL (ref 3.5–5.5)
ALK PHOS: 56 IU/L (ref 39–117)
ALT: 20 IU/L (ref 0–32)
AST: 16 IU/L (ref 0–40)
Albumin/Globulin Ratio: 1.8 (ref 1.2–2.2)
BUN / CREAT RATIO: 12 (ref 9–23)
BUN: 10 mg/dL (ref 6–24)
Bilirubin Total: 0.3 mg/dL (ref 0.0–1.2)
CO2: 22 mmol/L (ref 20–29)
CREATININE: 0.86 mg/dL (ref 0.57–1.00)
Calcium: 9.4 mg/dL (ref 8.7–10.2)
Chloride: 106 mmol/L (ref 96–106)
GFR calc non Af Amer: 80 mL/min/{1.73_m2} (ref >59)
GFR, EST AFRICAN AMERICAN: 92 mL/min/{1.73_m2} (ref >59)
Globulin, Total: 2.6 g/dL (ref 1.5–4.5)
Glucose: 93 mg/dL (ref 65–99)
Potassium: 4.8 mmol/L (ref 3.5–5.2)
Sodium: 143 mmol/L (ref 134–144)
TOTAL PROTEIN: 7.2 g/dL (ref 6.0–8.5)

## 2018-09-26 LAB — HGB A1C W/O EAG: HEMOGLOBIN A1C: 5.7 % — AB (ref 4.8–5.6)

## 2018-09-26 LAB — LIPID PANEL
CHOLESTEROL TOTAL: 157 mg/dL (ref 100–199)
Chol/HDL Ratio: 2.9 ratio (ref 0.0–4.4)
HDL: 54 mg/dL (ref >39)
LDL Calculated: 95 mg/dL (ref 0–99)
Triglycerides: 41 mg/dL (ref 0–149)
VLDL Cholesterol Cal: 8 mg/dL (ref 5–40)

## 2018-09-26 LAB — TSH: TSH: 2.73 u[IU]/mL (ref 0.450–4.500)

## 2018-09-26 NOTE — Progress Notes (Signed)
Lab results from 09/25/18 have been routed to the ordering Provider Joycelyn ManJennifer Burnette PA-C 09/26/18-Laren Whaling CMA

## 2018-09-26 NOTE — Telephone Encounter (Signed)
-----   Message from Margaretann LovelessJennifer M Burnette, PA-C sent at 09/26/2018  8:18 AM EST ----- A1c up borderline to 5.7, was 5.5. All other labs are all normal and stable.

## 2018-09-26 NOTE — Telephone Encounter (Signed)
Patient was advised.  

## 2018-10-09 ENCOUNTER — Other Ambulatory Visit: Payer: Self-pay

## 2018-10-09 ENCOUNTER — Encounter: Payer: Self-pay | Admitting: Physician Assistant

## 2018-10-09 ENCOUNTER — Ambulatory Visit (INDEPENDENT_AMBULATORY_CARE_PROVIDER_SITE_OTHER): Payer: Managed Care, Other (non HMO) | Admitting: Physician Assistant

## 2018-10-09 ENCOUNTER — Other Ambulatory Visit (HOSPITAL_COMMUNITY)
Admission: RE | Admit: 2018-10-09 | Discharge: 2018-10-09 | Disposition: A | Discharge status: Home / Self Care | Payer: Managed Care, Other (non HMO) | Source: Ambulatory Visit | Attending: Physician Assistant | Admitting: Physician Assistant

## 2018-10-09 VITALS — BP 109/70 | HR 67 | Temp 98.2°F | Resp 16 | Ht 62.0 in | Wt 174.6 lb

## 2018-10-09 DIAGNOSIS — E6609 Other obesity due to excess calories: Secondary | ICD-10-CM

## 2018-10-09 DIAGNOSIS — Z0000 Encounter for general adult medical examination without abnormal findings: Secondary | ICD-10-CM

## 2018-10-09 DIAGNOSIS — Z124 Encounter for screening for malignant neoplasm of cervix: Secondary | ICD-10-CM

## 2018-10-09 DIAGNOSIS — Z1239 Encounter for other screening for malignant neoplasm of breast: Secondary | ICD-10-CM | POA: Diagnosis not present

## 2018-10-09 DIAGNOSIS — Z6831 Body mass index (BMI) 31.0-31.9, adult: Secondary | ICD-10-CM

## 2018-10-09 DIAGNOSIS — Z1211 Encounter for screening for malignant neoplasm of colon: Secondary | ICD-10-CM

## 2018-10-09 DIAGNOSIS — R002 Palpitations: Secondary | ICD-10-CM

## 2018-10-09 DIAGNOSIS — Z23 Encounter for immunization: Secondary | ICD-10-CM | POA: Diagnosis not present

## 2018-10-09 DIAGNOSIS — B0229 Other postherpetic nervous system involvement: Secondary | ICD-10-CM

## 2018-10-09 MED ORDER — METOPROLOL SUCCINATE ER 25 MG PO TB24: 25.0000 mg | ORAL_TABLET | Freq: Every day | ORAL | 0 refills | Status: DC

## 2018-10-09 MED ORDER — GABAPENTIN 100 MG PO CAPS: 100.0000 mg | ORAL_CAPSULE | Freq: Every day | ORAL | 0 refills | Status: DC

## 2018-10-09 MED ORDER — PROPRANOLOL HCL 20 MG PO TABS: 20.0000 mg | ORAL_TABLET | Freq: Three times a day (TID) | ORAL | 3 refills | Status: DC | PRN

## 2018-10-09 NOTE — Patient Instructions (Signed)

## 2018-10-09 NOTE — Progress Notes (Signed)
Patient: Carly Stanley, Female    DOB: 04-21-1971, 47 y.o.   MRN: [id number] Visit Date: 10/09/2018  Today's Provider: Margaretann Loveless, PA-C   Chief Complaint  Patient presents with  . Annual Exam   Subjective:    Annual physical exam Carly Stanley is a 47 y.o. female who presents today for health maintenance and complete physical. She feels well. She reports exercising yes. She reports she is sleeping fairly well. -----------------------------------------------------------------   Review of Systems  Constitutional: Negative.   HENT: Negative.   Eyes: Negative.   Respiratory: Negative.   Cardiovascular: Negative.   Gastrointestinal: Negative.   Endocrine: Negative.   Genitourinary: Negative.   Musculoskeletal: Negative.   Skin: Negative.   Allergic/Immunologic: Negative.   Neurological: Negative.   Hematological: Negative.   Psychiatric/Behavioral: Negative.     Social History      She  reports that she has never smoked. She has never used smokeless tobacco. She reports that she does not drink alcohol or use drugs.       Social History   Socioeconomic History  . Marital status: Single    Spouse name: Not on file  . Number of children: 2  . Years of education: Not on file  . Highest education level: Some college, no degree  Occupational History    Employer: [employer]  Social Needs  . Financial resource strain: Not on file  . Food insecurity:    Worry: Not on file    Inability: Not on file  . Transportation needs:    Medical: Not on file    Non-medical: Not on file  Tobacco Use  . Smoking status: Never Smoker  . Smokeless tobacco: Never Used  Substance and Sexual Activity  . Alcohol use: No    Frequency: Never  . Drug use: No  . Sexual activity: Yes  Lifestyle  . Physical activity:    Days per week: 0 days    Minutes per session: 0 min  . Stress: Not at all  Relationships  . Social connections:    Talks on phone: More than three times  a week    Gets together: More than three times a week    Attends religious service: More than 4 times per year    Active member of club or organization: No    Attends meetings of clubs or organizations: Never    Relationship status: Divorced  Other Topics Concern  . Not on file  Social History Narrative  . Not on file    Past Medical History:  Diagnosis Date  . PAC (premature atrial contraction)   . Pre-diabetes 2016     Patient Active Problem List   Diagnosis Date Noted  . Palpitations 11/29/2017  . Shortness of breath 11/29/2017  . PAC (premature atrial contraction) 11/29/2017  . OAB (overactive bladder) 09/28/2017  . Mixed incontinence 09/28/2017    Past Surgical History:  Procedure Laterality Date  . CERVICAL DISC SURGERY  2016  . CHOLECYSTECTOMY    . OVARIAN CYST DRAINAGE      Family History        Family Status  Relation Name Status  . Mother  (Not Specified)  . Father  (Not Specified)  . Brother  (Not Specified)  . MGM  (Not Specified)  . PGF  (Not Specified)  . Son  (Not Specified)  . PGM  (Not Specified)        Her family history includes ADD /  ADHD in her son; Anxiety disorder in her mother and son; Atrial fibrillation in her paternal grandmother; Breast cancer (age of onset: 23) in her mother; Cancer in her maternal grandmother; Diabetes in her brother; Heart attack in her paternal grandmother; Hyperlipidemia in her mother; Hypertension in her father, mother, and paternal grandmother; Lung cancer (age of onset: 26) in her paternal grandfather.      No Known Allergies   Current Outpatient Medications:  .  cyclobenzaprine (FLEXERIL) 5 MG tablet, Take 1 tablet (5 mg total) by mouth 3 (three) times daily as needed for muscle spasms., Disp: 30 tablet, Rfl: 1 .  gabapentin (NEURONTIN) 100 MG capsule, Take 1 capsule (100 mg total) by mouth 3 (three) times daily., Disp: 90 capsule, Rfl: 0 .  propranolol (INDERAL) 20 MG tablet, Take 1 tablet (20 mg total) by  mouth 3 (three) times daily as needed., Disp: 90 tablet, Rfl: 3 .  valACYclovir (VALTREX) 1000 MG tablet, Take 1 tablet (1,000 mg total) by mouth 3 (three) times daily., Disp: 21 tablet, Rfl: 0 .  HYDROcodone-acetaminophen (NORCO/VICODIN) 5-325 MG tablet, Take 1 tablet by mouth every 4 (four) hours as needed for moderate pain. (Patient not taking: Reported on 10/09/2018), Disp: 30 tablet, Rfl: 0 .  nebivolol (BYSTOLIC) 5 MG tablet, Take 1 tablet (5 mg total) by mouth daily. (Patient not taking: Reported on 10/09/2018), Disp: 90 tablet, Rfl: 3   Patient Care Team: Margaretann Loveless, PA-C as PCP - General (Family Medicine)      Objective:   Vitals: BP 109/70 (BP Location: Left Arm, Patient Position: Sitting, Cuff Size: Normal)   Pulse 67   Temp 98.2 F (36.8 C) (Oral)   Resp 16   Ht 5\' 2"  (1.575 m)   Wt 174 lb 9.6 oz (79.2 kg)   BMI 31.93 kg/m    Vitals:   10/09/18 1013  BP: 109/70  Pulse: 67  Resp: 16  Temp: 98.2 F (36.8 C)  TempSrc: Oral  Weight: 174 lb 9.6 oz (79.2 kg)  Height: 5\' 2"  (1.575 m)     Physical Exam  Constitutional: She is oriented to person, place, and time. She appears well-developed and well-nourished. No distress.  HENT:  Head: Normocephalic and atraumatic.  Right Ear: Hearing, tympanic membrane, external ear and ear canal normal.  Left Ear: Hearing, tympanic membrane, external ear and ear canal normal.  Nose: Nose normal.  Mouth/Throat: Uvula is midline, oropharynx is clear and moist and mucous membranes are normal. No oropharyngeal exudate.  Eyes: Pupils are equal, round, and reactive to light. Conjunctivae and EOM are normal. Right eye exhibits no discharge. Left eye exhibits no discharge. No scleral icterus.  Neck: Normal range of motion. Neck supple. No JVD present. Carotid bruit is not present. No tracheal deviation present. No thyromegaly present.  Cardiovascular: Normal rate, regular rhythm, normal heart sounds and intact distal pulses. Exam  reveals no gallop and no friction rub.  No murmur heard. Pulmonary/Chest: Effort normal and breath sounds normal. No respiratory distress. She has no wheezes. She has no rales. She exhibits no tenderness. Right breast exhibits no inverted nipple, no mass, no nipple discharge, no skin change and no tenderness. Left breast exhibits no inverted nipple, no mass, no nipple discharge, no skin change and no tenderness. No breast tenderness, discharge or bleeding. Breasts are symmetrical.  Abdominal: Soft. Bowel sounds are normal. She exhibits no distension and no mass. There is no tenderness. There is no rebound and no guarding. Hernia confirmed negative in  the right inguinal area and confirmed negative in the left inguinal area.  Genitourinary: Rectum normal, vagina normal and uterus normal. No breast tenderness, discharge or bleeding. Pelvic exam was performed with patient supine. There is no rash, tenderness, lesion or injury on the right labia. There is no rash, tenderness, lesion or injury on the left labia. Cervix exhibits no motion tenderness, no discharge and no friability. Right adnexum displays no mass, no tenderness and no fullness. Left adnexum displays no mass, no tenderness and no fullness. No erythema, tenderness or bleeding in the vagina. No signs of injury around the vagina. No vaginal discharge found.  Musculoskeletal: Normal range of motion. She exhibits no edema or tenderness.  Lymphadenopathy:    She has no cervical adenopathy.       Right: No inguinal adenopathy present.       Left: No inguinal adenopathy present.  Neurological: She is alert and oriented to person, place, and time. She has normal reflexes. No cranial nerve deficit. Coordination normal.  Skin: Skin is warm and dry. No rash noted. She is not diaphoretic.  Psychiatric: She has a normal mood and affect. Her behavior is normal. Judgment and thought content normal.  Vitals reviewed.    Depression Screen PHQ 2/9 Scores  10/09/2018 05/16/2018  PHQ - 2 Score 0 0  PHQ- 9 Score 0 -      Assessment & Plan:     Routine Health Maintenance and Physical Exam  Exercise Activities and Dietary recommendations Goals   None     Immunization History  Administered Date(s) Administered  . Tdap 07/11/2008    Health Maintenance  Topic Date Due  . HIV Screening  11/22/1983  . INFLUENZA VACCINE  06/01/2018  . TETANUS/TDAP  07/11/2018  . PAP SMEAR  07/11/2021     Discussed health benefits of physical activity, and encouraged her to engage in regular exercise appropriate for her age and condition.    1. Annual physical exam Normal exam. Labs normal. Up to date on vaccinations.   2. Cervical cancer screening Pap collected today. Will send as below and f/u pending results. - Cytology - PAP  3. Breast cancer screening Breast exam today was normal. There is no family history of breast cancer. She does perform regular self breast exams. Mammogram was ordered as below. Information for [facility] was given to patient so she may schedule her mammogram at her convenience. - MM 3D SCREEN BREAST BILATERAL; Future  4. Colon cancer screening Due for colonoscopy, initial screen. No family history.  - Ambulatory referral to Gastroenterology  5. Postherpetic neuralgia Stable. Down to one daily. Diagnosis pulled for medication refill. Continue current medical treatment plan. - gabapentin (NEURONTIN) 100 MG capsule; Take 1 capsule (100 mg total) by mouth at bedtime.  Dispense: 90 capsule; Refill: 0  6. Palpitations Stable. Diagnosis pulled for medication refill. Continue current medical treatment plan. - propranolol (INDERAL) 20 MG tablet; Take 1 tablet (20 mg total) by mouth 3 (three) times daily as needed.  Dispense: 90 tablet; Refill: 3 - metoprolol succinate (TOPROL-XL) 25 MG 24 hr tablet; Take 1 tablet (25 mg total) by mouth daily.  Dispense: 30 tablet; Refill: 0  7. Class 1 obesity due to excess  calories with serious comorbidity and body mass index (BMI) of 31.0 to 31.9 in adult Counseled patient on healthy lifestyle modifications including dieting and exercise.   8. Need for Td vaccine Td booster Vaccine given to patient without complications. Patient sat for 15 minutes  after administration and was tolerated well without adverse effects. - Td vaccine greater than or equal to 7yo preservative free IM  --------------------------------------------------------------------    Margaretann LovelessJennifer M Burnette, PA-C  [facility]

## 2018-10-10 LAB — CYTOLOGY - PAP
DIAGNOSIS: NEGATIVE
HPV: NOT DETECTED

## 2018-10-11 ENCOUNTER — Telehealth: Payer: Self-pay

## 2018-10-11 NOTE — Telephone Encounter (Signed)
-----   Message from Margaretann LovelessJennifer M Burnette, [place]PA-C sent at 10/11/2018  9:08 AM EST ----- Pap is normal, HPV negative.  Will repeat in 3-5 years.

## 2018-10-11 NOTE — Telephone Encounter (Signed)
Viewed by Lewis MoccasinMelissa Laskowski on 10/11/2018 9:15 AM

## 2018-10-30 ENCOUNTER — Encounter: Payer: Self-pay | Admitting: *Deleted

## 2018-10-31 ENCOUNTER — Ambulatory Visit
Admission: RE | Admit: 2018-10-31 | Discharge: 2018-10-31 | Disposition: A | Discharge status: Home / Self Care | Payer: Managed Care, Other (non HMO) | Source: Ambulatory Visit | Attending: Physician Assistant | Admitting: Physician Assistant

## 2018-10-31 DIAGNOSIS — Z1239 Encounter for other screening for malignant neoplasm of breast: Secondary | ICD-10-CM | POA: Insufficient documentation

## 2018-11-17 ENCOUNTER — Other Ambulatory Visit: Payer: Self-pay | Admitting: Podiatry

## 2018-11-17 ENCOUNTER — Encounter: Payer: Self-pay | Admitting: Podiatry

## 2018-11-17 ENCOUNTER — Ambulatory Visit: Payer: Managed Care, Other (non HMO) | Admitting: Podiatry

## 2018-11-17 ENCOUNTER — Ambulatory Visit (INDEPENDENT_AMBULATORY_CARE_PROVIDER_SITE_OTHER): Payer: Managed Care, Other (non HMO)

## 2018-11-17 VITALS — BP 103/67 | HR 68

## 2018-11-17 DIAGNOSIS — M7751 Other enthesopathy of right foot: Secondary | ICD-10-CM | POA: Diagnosis not present

## 2018-11-17 DIAGNOSIS — M79671 Pain in right foot: Secondary | ICD-10-CM

## 2018-11-17 DIAGNOSIS — M722 Plantar fascial fibromatosis: Secondary | ICD-10-CM

## 2018-11-17 MED ORDER — METHYLPREDNISOLONE 4 MG PO TBPK: ORAL_TABLET | ORAL | 0 refills | Status: DC

## 2018-11-17 MED ORDER — MELOXICAM 15 MG PO TABS: 15.0000 mg | ORAL_TABLET | Freq: Every day | ORAL | 1 refills | Status: DC

## 2018-11-19 NOTE — Progress Notes (Signed)
   HPI: 48 year old female presenting today as a new patient with a chief complaint of sharp pain between the toes that radiates to the ball of the left foot that began 2-3 weeks ago. She states it feels as if she has a bruise. Walking and being on the foot throughout the day increases the pain. She has not done anything for treatment. Patient is here for further evaluation and treatment.   Past Medical History:  Diagnosis Date  . PAC (premature atrial contraction)   . Pre-diabetes 2016     Physical Exam: General: The patient is alert and oriented x3 in no acute distress.  Dermatology: Skin is warm, dry and supple bilateral lower extremities. Negative for open lesions or macerations.  Vascular: Palpable pedal pulses bilaterally. No edema or erythema noted. Capillary refill within normal limits.  Neurological: Epicritic and protective threshold grossly intact bilaterally.   Musculoskeletal Exam: Pain with palpation noted to the 2nd MPJ of the left foot. Range of motion within normal limits to all pedal and ankle joints bilateral. Muscle strength 5/5 in all groups bilateral.   Radiographic Exam:  Normal osseous mineralization. Joint spaces preserved. No fracture/dislocation/boney destruction.    Assessment: 1. 2nd MPJ capsulitis left    Plan of Care:  1. Patient evaluated. X-Rays reviewed.  2. Injection of 0.5 mLs Celestone Soluspan injected into the 2nd MPJ of the left foot.  3. Prescription for Medrol Dose Pak provided to patient. 4. Prescription for Meloxicam provided to patient. 5. Offloading met pads (small) dispensed.  6. Post op shoe dispensed.  7. Return to clinic in 4 weeks.   She is a paramedic.     Edrick Kins, DPM [facility]  Dr. Edrick Kins, DPM    [address], [place] [address]                Office [phone]  Fax [phone]

## 2018-12-15 ENCOUNTER — Ambulatory Visit: Payer: Managed Care, Other (non HMO) | Admitting: Podiatry

## 2019-01-19 ENCOUNTER — Ambulatory Visit: Payer: Managed Care, Other (non HMO) | Admitting: Podiatry

## 2019-03-28 ENCOUNTER — Encounter: Payer: Self-pay | Admitting: Physician Assistant

## 2019-03-28 DIAGNOSIS — N3000 Acute cystitis without hematuria: Secondary | ICD-10-CM

## 2019-03-28 MED ORDER — CEPHALEXIN 500 MG PO CAPS: 500.0000 mg | ORAL_CAPSULE | Freq: Two times a day (BID) | ORAL | 0 refills | Status: DC

## 2019-07-26 ENCOUNTER — Other Ambulatory Visit: Payer: Self-pay | Admitting: Podiatry

## 2019-07-29 IMAGING — MG DIGITAL SCREENING BILATERAL MAMMOGRAM WITH TOMO AND CAD
8 series · 8 of 24 positions shown · non-contrast
Comparison: Previous exam(s).

CLINICAL DATA: Screening.

EXAM:
DIGITAL SCREENING BILATERAL MAMMOGRAM WITH TOMO AND CAD

[R MLO synth-2D]
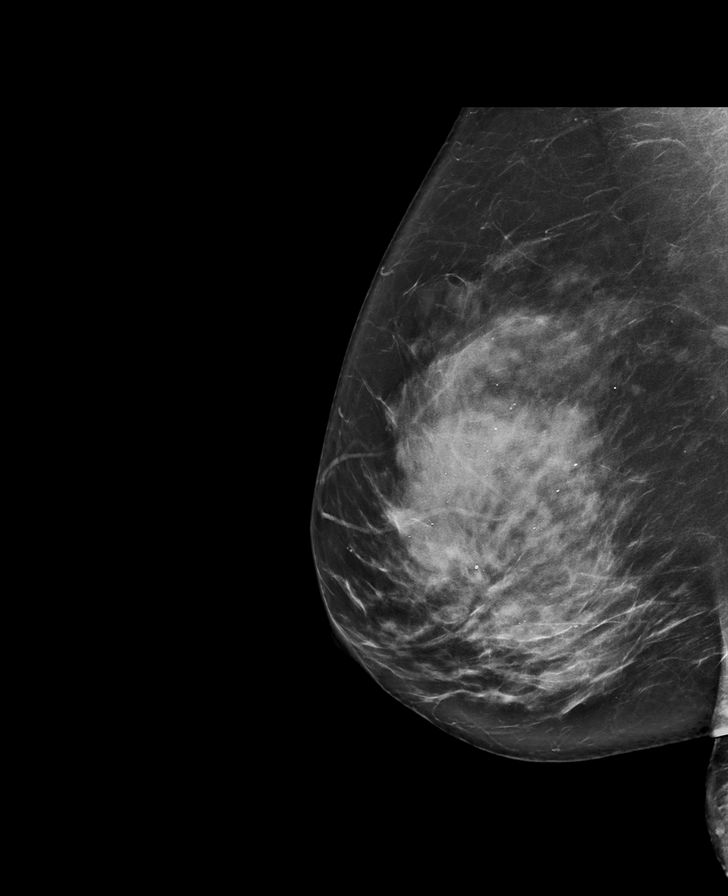

[R CC synth-2D]
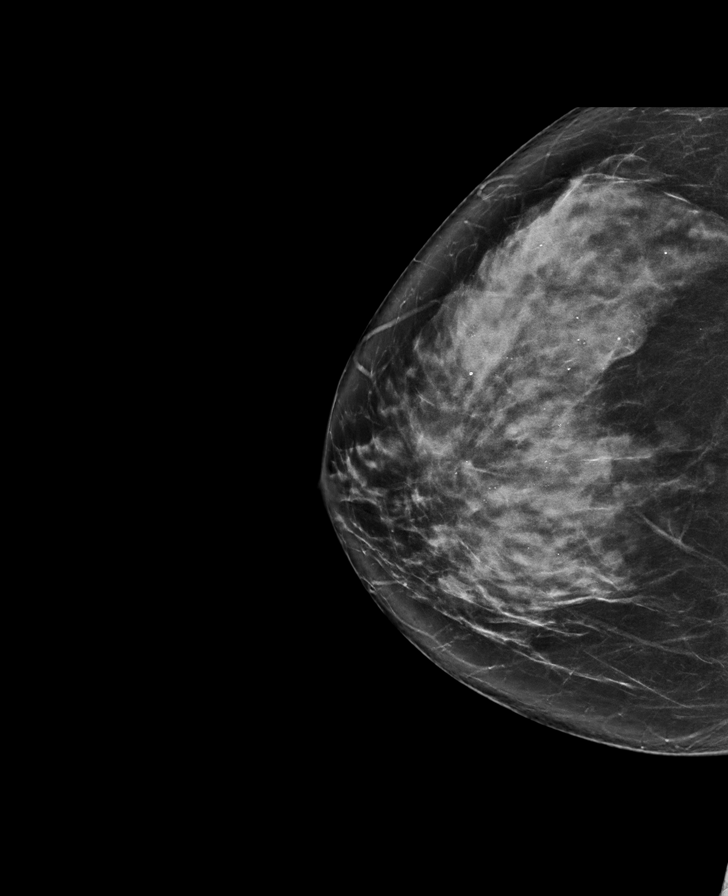

[L MLO synth-2D]
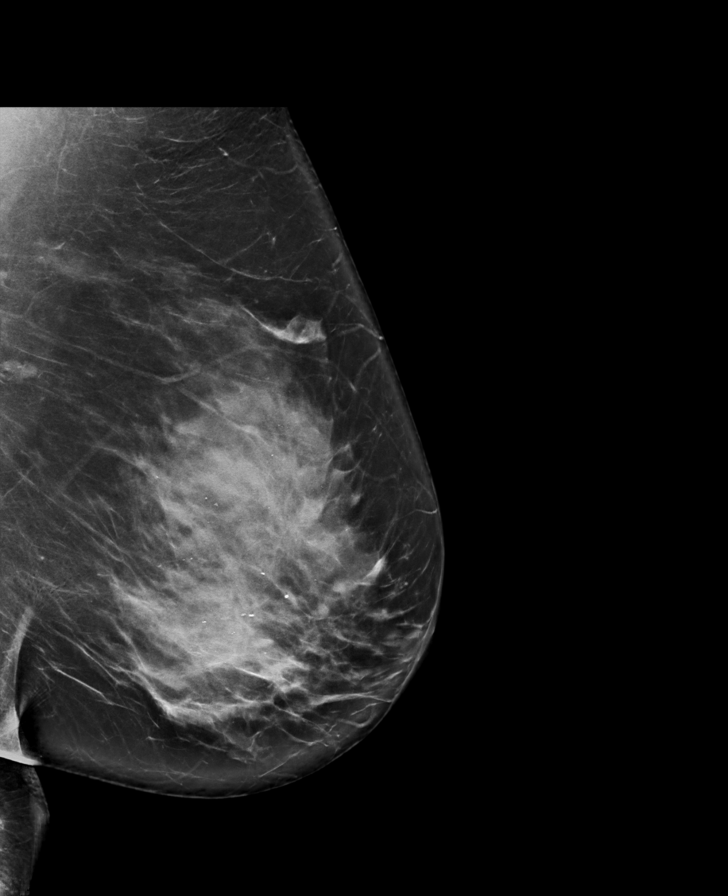

[L CC synth-2D]
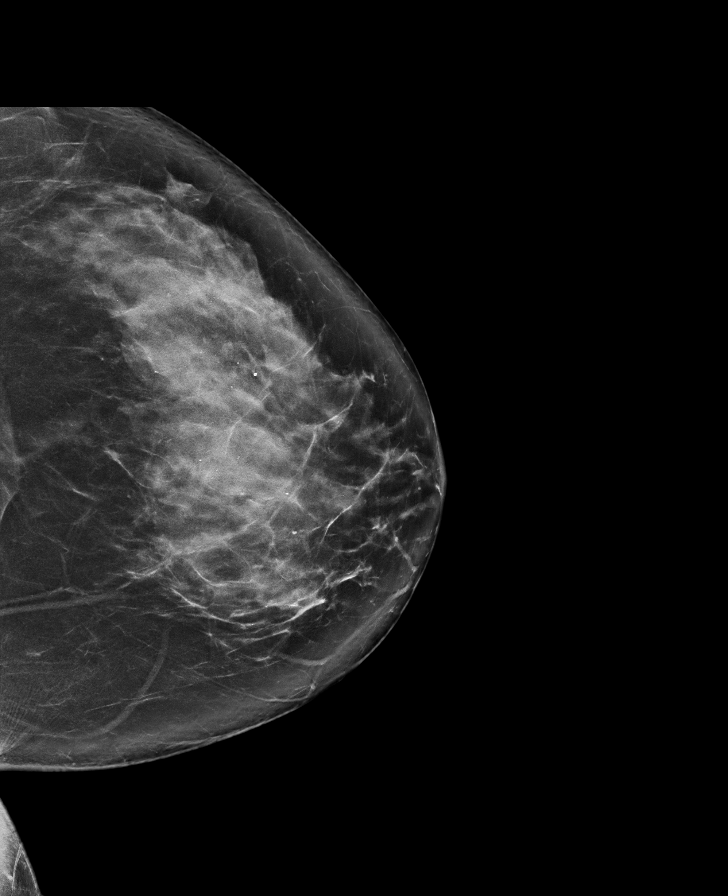

[R CC tomo · tomo slice 41/80.0]
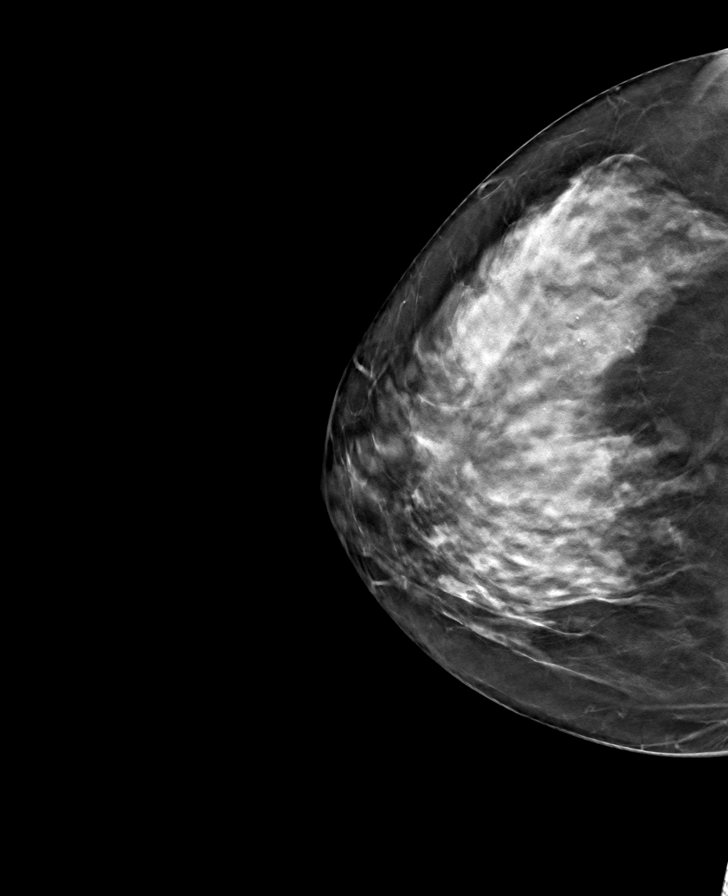

[L MLO tomo · tomo slice 47/92.0]
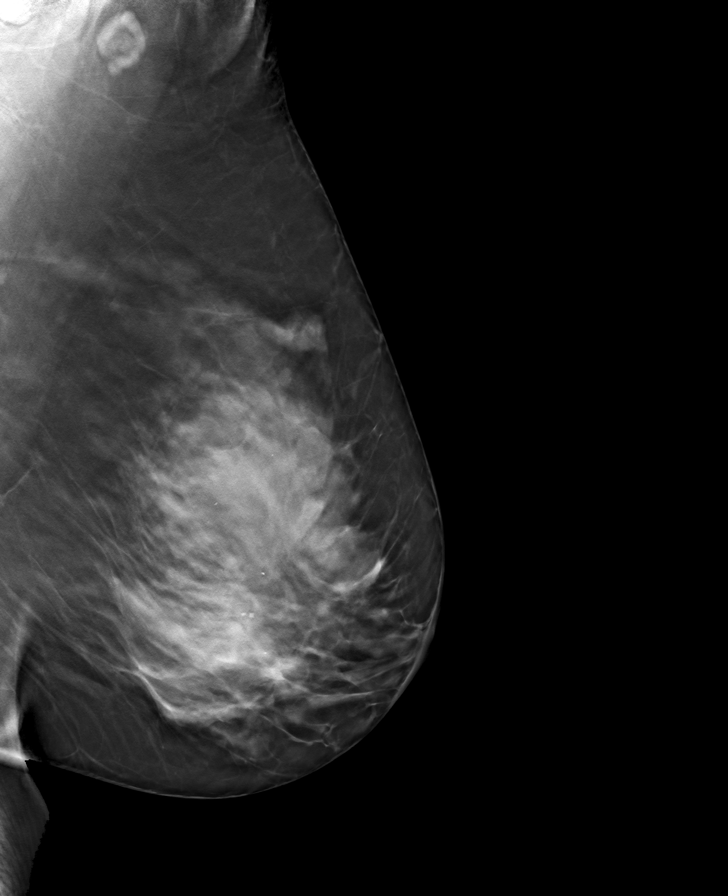

[L CC tomo · tomo slice 45/88.0]
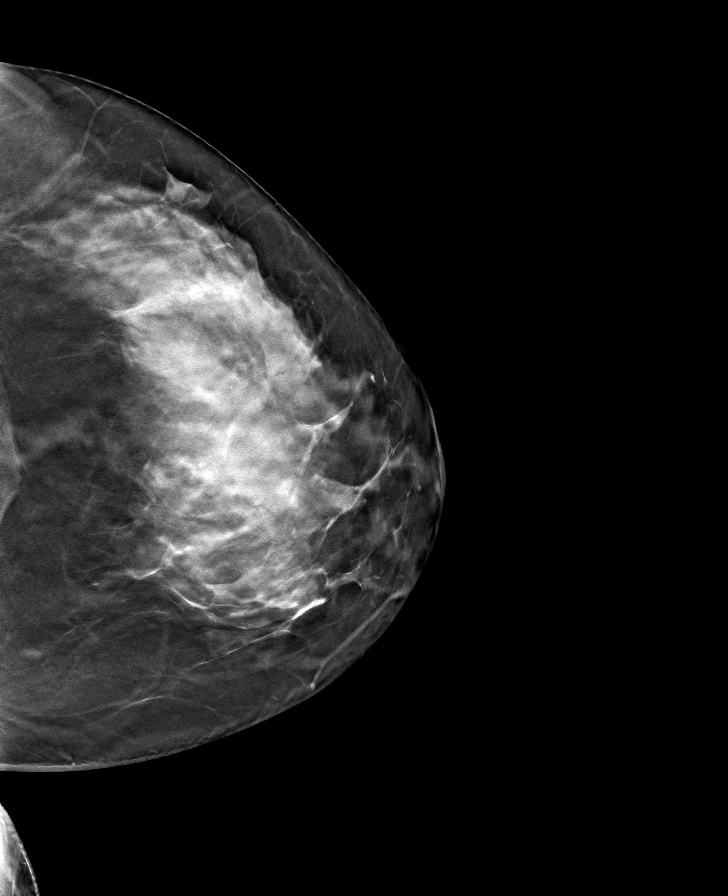

[R MLO tomo · tomo slice 45/88.0]
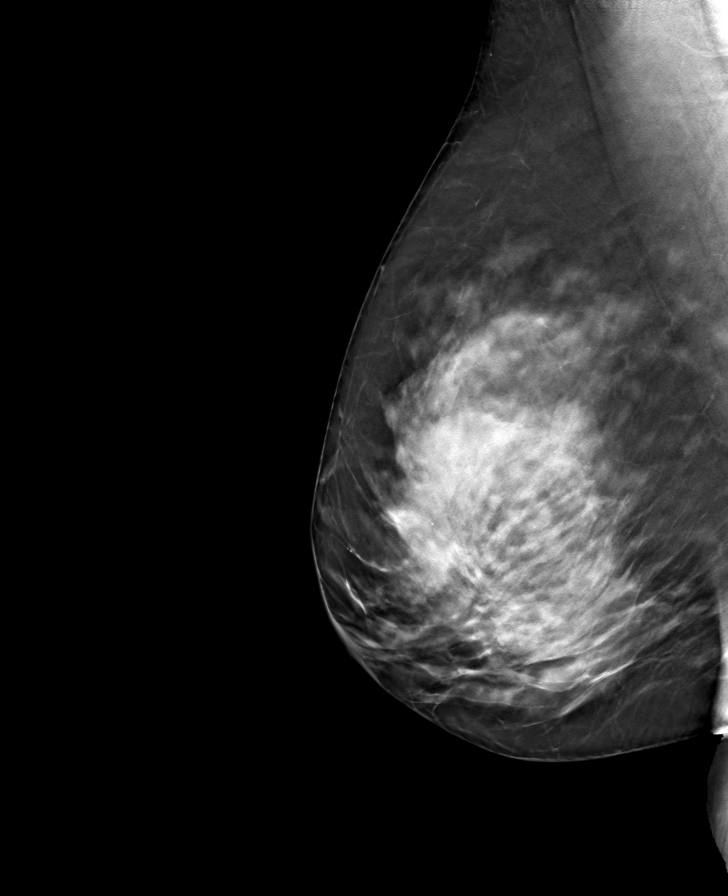

[8 of 24 positions shown; findings below may reference images not displayed]

ACR Breast Density Category d: The breast tissue is extremely dense,
which lowers the sensitivity of mammography
FINDINGS: There are no findings suspicious for malignancy. Images were
processed with CAD.
IMPRESSION: No mammographic evidence of malignancy. A result letter of this
screening mammogram will be mailed directly to the patient.

RECOMMENDATION:
Screening mammogram in one year. (Code:[id number]-[id number])

BI-RADS CATEGORY  1: Negative.

## 2019-10-08 ENCOUNTER — Telehealth: Payer: Self-pay

## 2019-10-08 DIAGNOSIS — R7303 Prediabetes: Secondary | ICD-10-CM

## 2019-10-08 DIAGNOSIS — Z0000 Encounter for general adult medical examination without abnormal findings: Secondary | ICD-10-CM

## 2019-10-08 NOTE — Addendum Note (Signed)
Addended by: Doristine Devoid on: 10/08/2019 04:22 PM   Modules accepted: Orders

## 2019-10-08 NOTE — Telephone Encounter (Signed)
Copied from [place] [id number]. Topic: General - Inquiry >> Oct 08, 2019  1:34 PM Scherrie Gerlach wrote: Reason for CRM:  pt wants to know if Anderson Malta will put in orders for her to get labs done prior to her cpe on Friday.  Please call so she can schedule

## 2019-10-08 NOTE — Addendum Note (Signed)
Addended by: Mar Daring on: 10/08/2019 05:00 PM   Modules accepted: Orders

## 2019-10-08 NOTE — Telephone Encounter (Signed)
Labs ordered.

## 2019-10-09 NOTE — Telephone Encounter (Signed)
Patient advised.

## 2019-10-12 ENCOUNTER — Encounter: Payer: Self-pay | Admitting: Physician Assistant

## 2019-10-12 ENCOUNTER — Other Ambulatory Visit: Payer: Self-pay

## 2019-10-12 ENCOUNTER — Ambulatory Visit (INDEPENDENT_AMBULATORY_CARE_PROVIDER_SITE_OTHER): Payer: Managed Care, Other (non HMO) | Admitting: Physician Assistant

## 2019-10-12 VITALS — BP 122/82 | HR 66 | Temp 97.6°F | Resp 16 | Ht 63.0 in | Wt 195.6 lb

## 2019-10-12 DIAGNOSIS — Z6834 Body mass index (BMI) 34.0-34.9, adult: Secondary | ICD-10-CM

## 2019-10-12 DIAGNOSIS — Z1231 Encounter for screening mammogram for malignant neoplasm of breast: Secondary | ICD-10-CM

## 2019-10-12 DIAGNOSIS — R768 Other specified abnormal immunological findings in serum: Secondary | ICD-10-CM

## 2019-10-12 DIAGNOSIS — M2559 Pain in other specified joint: Secondary | ICD-10-CM

## 2019-10-12 DIAGNOSIS — Z1211 Encounter for screening for malignant neoplasm of colon: Secondary | ICD-10-CM | POA: Diagnosis not present

## 2019-10-12 DIAGNOSIS — Z0000 Encounter for general adult medical examination without abnormal findings: Secondary | ICD-10-CM

## 2019-10-12 DIAGNOSIS — E6609 Other obesity due to excess calories: Secondary | ICD-10-CM | POA: Diagnosis not present

## 2019-10-12 LAB — COMPREHENSIVE METABOLIC PANEL
ALT: 16 IU/L (ref 0–32)
AST: 17 IU/L (ref 0–40)
Albumin/Globulin Ratio: 1.5 (ref 1.2–2.2)
Albumin: 4.1 g/dL (ref 3.8–4.8)
Alkaline Phosphatase: 72 IU/L (ref 39–117)
BUN/Creatinine Ratio: 16 (ref 9–23)
BUN: 13 mg/dL (ref 6–24)
Bilirubin Total: 0.4 mg/dL (ref 0.0–1.2)
CO2: 18 mmol/L — ABNORMAL LOW (ref 20–29)
Calcium: 9.4 mg/dL (ref 8.7–10.2)
Chloride: 105 mmol/L (ref 96–106)
Creatinine, Ser: 0.82 mg/dL (ref 0.57–1.00)
GFR calc Af Amer: 96 mL/min/{1.73_m2} (ref >59)
GFR calc non Af Amer: 84 mL/min/{1.73_m2} (ref >59)
Globulin, Total: 2.8 g/dL (ref 1.5–4.5)
Glucose: 97 mg/dL (ref 65–99)
Potassium: 4.3 mmol/L (ref 3.5–5.2)
Sodium: 139 mmol/L (ref 134–144)
Total Protein: 6.9 g/dL (ref 6.0–8.5)

## 2019-10-12 LAB — CBC WITH DIFFERENTIAL/PLATELET
Basophils Absolute: 0.1 10*3/uL (ref 0.0–0.2)
Basos: 1 %
EOS (ABSOLUTE): 0.3 10*3/uL (ref 0.0–0.4)
Eos: 3 %
Hematocrit: 37.7 % (ref 34.0–46.6)
Hemoglobin: 12.6 g/dL (ref 11.1–15.9)
Immature Grans (Abs): 0 10*3/uL (ref 0.0–0.1)
Immature Granulocytes: 0 %
Lymphocytes Absolute: 2.2 10*3/uL (ref 0.7–3.1)
Lymphs: 29 %
MCH: 29.6 pg (ref 26.6–33.0)
MCHC: 33.4 g/dL (ref 31.5–35.7)
MCV: 89 fL (ref 79–97)
Monocytes Absolute: 0.7 10*3/uL (ref 0.1–0.9)
Monocytes: 9 %
Neutrophils Absolute: 4.3 10*3/uL (ref 1.4–7.0)
Neutrophils: 58 %
Platelets: 336 10*3/uL (ref 150–450)
RBC: 4.26 x10E6/uL (ref 3.77–5.28)
RDW: 12.9 % (ref 11.7–15.4)
WBC: 7.5 10*3/uL (ref 3.4–10.8)

## 2019-10-12 LAB — HEMOGLOBIN A1C
Est. average glucose Bld gHb Est-mCnc: 111 mg/dL
Hgb A1c MFr Bld: 5.5 % (ref 4.8–5.6)

## 2019-10-12 LAB — TSH: TSH: 2.74 u[IU]/mL (ref 0.450–4.500)

## 2019-10-12 LAB — LIPID PANEL
Chol/HDL Ratio: 2.8 ratio (ref 0.0–4.4)
Cholesterol, Total: 146 mg/dL (ref 100–199)
HDL: 53 mg/dL (ref >39)
LDL Chol Calc (NIH): 81 mg/dL (ref 0–99)
Triglycerides: 59 mg/dL (ref 0–149)
VLDL Cholesterol Cal: 12 mg/dL (ref 5–40)

## 2019-10-12 NOTE — Patient Instructions (Signed)
Health Maintenance, Female Adopting a healthy lifestyle and getting preventive care are important in promoting health and wellness. Ask your health care provider about:  The right schedule for you to have regular tests and exams.  Things you can do on your own to prevent diseases and keep yourself healthy. What should I know about diet, weight, and exercise? Eat a healthy diet   Eat a diet that includes plenty of vegetables, fruits, low-fat dairy products, and lean protein.  Do not eat a lot of foods that are high in solid fats, added sugars, or sodium. Maintain a healthy weight Body mass index (BMI) is used to identify weight problems. It estimates body fat based on height and weight. Your health care provider can help determine your BMI and help you achieve or maintain a healthy weight. Get regular exercise Get regular exercise. This is one of the most important things you can do for your health. Most adults should:  Exercise for at least 150 minutes each week. The exercise should increase your heart rate and make you sweat (moderate-intensity exercise).  Do strengthening exercises at least twice a week. This is in addition to the moderate-intensity exercise.  Spend less time sitting. Even light physical activity can be beneficial. Watch cholesterol and blood lipids Have your blood tested for lipids and cholesterol at 50 years of age, then have this test every 5 years. Have your cholesterol levels checked more often if:  Your lipid or cholesterol levels are high.  You are older than 50 years of age.  You are at high risk for heart disease. What should I know about cancer screening? Depending on your health history and family history, you may need to have cancer screening at various ages. This may include screening for:  Breast cancer.  Cervical cancer.  Colorectal cancer.  Skin cancer.  Lung cancer. What should I know about heart disease, diabetes, and high blood  pressure? Blood pressure and heart disease  High blood pressure causes heart disease and increases the risk of stroke. This is more likely to develop in people who have high blood pressure readings, are of African descent, or are overweight.  Have your blood pressure checked: ? Every 3-5 years if you are 18-39 years of age. ? Every year if you are 40 years old or older. Diabetes Have regular diabetes screenings. This checks your fasting blood sugar level. Have the screening done:  Once every three years after age 40 if you are at a normal weight and have a low risk for diabetes.  More often and at a younger age if you are overweight or have a high risk for diabetes. What should I know about preventing infection? Hepatitis B If you have a higher risk for hepatitis B, you should be screened for this virus. Talk with your health care provider to find out if you are at risk for hepatitis B infection. Hepatitis C Testing is recommended for:  Everyone born from 1945 through 1965.  Anyone with known risk factors for hepatitis C. Sexually transmitted infections (STIs)  Get screened for STIs, including gonorrhea and chlamydia, if: ? You are sexually active and are younger than 50 years of age. ? You are older than 50 years of age and your health care provider tells you that you are at risk for this type of infection. ? Your sexual activity has changed since you were last screened, and you are at increased risk for chlamydia or gonorrhea. Ask your health care provider if   you are at risk.  Ask your health care provider about whether you are at high risk for HIV. Your health care provider may recommend a prescription medicine to help prevent HIV infection. If you choose to take medicine to prevent HIV, you should first get tested for HIV. You should then be tested every 3 months for as long as you are taking the medicine. Pregnancy  If you are about to stop having your period (premenopausal) and  you may become pregnant, seek counseling before you get pregnant.  Take 400 to 800 micrograms (mcg) of folic acid every day if you become pregnant.  Ask for birth control (contraception) if you want to prevent pregnancy. Osteoporosis and menopause Osteoporosis is a disease in which the bones lose minerals and strength with aging. This can result in bone fractures. If you are 65 years old or older, or if you are at risk for osteoporosis and fractures, ask your health care provider if you should:  Be screened for bone loss.  Take a calcium or vitamin D supplement to lower your risk of fractures.  Be given hormone replacement therapy (HRT) to treat symptoms of menopause. Follow these instructions at home: Lifestyle  Do not use any products that contain nicotine or tobacco, such as cigarettes, e-cigarettes, and chewing tobacco. If you need help quitting, ask your health care provider.  Do not use street drugs.  Do not share needles.  Ask your health care provider for help if you need support or information about quitting drugs. Alcohol use  Do not drink alcohol if: ? Your health care provider tells you not to drink. ? You are pregnant, may be pregnant, or are planning to become pregnant.  If you drink alcohol: ? Limit how much you use to 0-1 drink a day. ? Limit intake if you are breastfeeding.  Be aware of how much alcohol is in your drink. In the [place] one drink equals one 12 oz bottle of beer (355 mL), one 5 oz glass of wine (148 mL), or one 1 oz glass of hard liquor (44 mL). General instructions  Schedule regular health, dental, and eye exams.  Stay current with your vaccines.  Tell your health care provider if: ? You often feel depressed. ? You have ever been abused or do not feel safe at home. Summary  Adopting a healthy lifestyle and getting preventive care are important in promoting health and wellness.  Follow your health care provider's instructions about healthy  diet, exercising, and getting tested or screened for diseases.  Follow your health care provider's instructions on monitoring your cholesterol and blood pressure. This information is not intended to replace advice given to you by your health care provider. Make sure you discuss any questions you have with your health care provider. Document Released: 05/03/2011 Document Revised: 10/11/2018 Document Reviewed: 10/11/2018 Elsevier Patient Education  2020 [employer]  

## 2019-10-12 NOTE — Progress Notes (Signed)
Patient: Carly Stanley, Female    DOB: Feb 20, 1971, 48 y.o.   MRN: [id number] Visit Date: 10/12/2019  Today's Provider: Mar Daring, PA-C   Chief Complaint  Patient presents with  . Annual Exam   Subjective:     Annual physical exam Carly Stanley is a 48 y.o. female who presents today for health maintenance and complete physical. She feels well. She reports exercising none. She reports she is sleeping poorly, gets up 2-3 times at night. ----------------------------------------------------------------- Pap: 10/11/18-Pap is normal, HPV negative. Will repeat in 5 years. Mammogram: 10/31/2018 Normal  Review of Systems  Constitutional: Positive for fatigue and unexpected weight change.  HENT: Negative.   Eyes: Negative.   Respiratory: Negative.   Cardiovascular: Positive for palpitations (on propanolol).  Gastrointestinal: Negative.   Endocrine: Negative.   Genitourinary: Negative.   Musculoskeletal: Positive for arthralgias, joint swelling and myalgias.  Skin: Negative.   Allergic/Immunologic: Negative.   Neurological: Positive for headaches.  Hematological: Negative.   Psychiatric/Behavioral: Positive for sleep disturbance.    Social History      She  reports that she has never smoked. She has never used smokeless tobacco. She reports that she does not drink alcohol or use drugs.       Social History   Socioeconomic History  . Marital status: Single    Spouse name: Not on file  . Number of children: 2  . Years of education: Not on file  . Highest education level: Some college, no degree  Occupational History    Employer: [employer]  Tobacco Use  . Smoking status: Never Smoker  . Smokeless tobacco: Never Used  Substance and Sexual Activity  . Alcohol use: No  . Drug use: No  . Sexual activity: Yes  Other Topics Concern  . Not on file  Social History Narrative  . Not on file   Social Determinants of Health   Financial Resource Strain:   .  Difficulty of Paying Living Expenses: Not on file  Food Insecurity:   . Worried About Charity fundraiser in the Last Year: Not on file  . Ran Out of Food in the Last Year: Not on file  Transportation Needs:   . Lack of Transportation (Medical): Not on file  . Lack of Transportation (Non-Medical): Not on file  Physical Activity: Inactive  . Days of Exercise per Week: 0 days  . Minutes of Exercise per Session: 0 min  Stress: No Stress Concern Present  . Feeling of Stress : Not at all  Social Connections: Somewhat Isolated  . Frequency of Communication with Friends and Family: More than three times a week  . Frequency of Social Gatherings with Friends and Family: More than three times a week  . Attends Religious Services: More than 4 times per year  . Active Member of Clubs or Organizations: No  . Attends Archivist Meetings: Never  . Marital Status: Divorced    Past Medical History:  Diagnosis Date  . PAC (premature atrial contraction)   . Pre-diabetes 2016     Patient Active Problem List   Diagnosis Date Noted  . Palpitations 11/29/2017  . Shortness of breath 11/29/2017  . PAC (premature atrial contraction) 11/29/2017  . OAB (overactive bladder) 09/28/2017  . Mixed incontinence 09/28/2017  . Pre-diabetes 2016    Past Surgical History:  Procedure Laterality Date  . [place] SURGERY  2016  . CHOLECYSTECTOMY    . OVARIAN CYST DRAINAGE  Family History        Family Status  Relation Name Status  . Mother  (Not Specified)  . Father  (Not Specified)  . Brother  (Not Specified)  . MGM  (Not Specified)  . PGF  (Not Specified)  . Son  (Not Specified)  . PGM  (Not Specified)        Her family history includes ADD / ADHD in her son; Anxiety disorder in her mother and son; Atrial fibrillation in her paternal grandmother; Breast cancer (age of onset: 70) in her mother; Cancer in her maternal grandmother; Diabetes in her brother; Heart attack in her  paternal grandmother; Hyperlipidemia in her mother; Hypertension in her father, mother, and paternal grandmother; Lung cancer (age of onset: 75) in her paternal grandfather.      No Known Allergies   Current Outpatient Medications:  .  cephALEXin (KEFLEX) 500 MG capsule, Take 1 capsule (500 mg total) by mouth 2 (two) times daily., Disp: 14 capsule, Rfl: 0 .  cyclobenzaprine (FLEXERIL) 5 MG tablet, Take 1 tablet (5 mg total) by mouth 3 (three) times daily as needed for muscle spasms., Disp: 30 tablet, Rfl: 1 .  gabapentin (NEURONTIN) 100 MG capsule, Take 1 capsule (100 mg total) by mouth at bedtime., Disp: 90 capsule, Rfl: 0 .  meloxicam (MOBIC) 15 MG tablet, TAKE 1 TABLET BY MOUTH DAILY, Disp: 60 tablet, Rfl: 1 .  metoprolol succinate (TOPROL-XL) 25 MG 24 hr tablet, Take 1 tablet (25 mg total) by mouth daily., Disp: 30 tablet, Rfl: 0 .  propranolol (INDERAL) 20 MG tablet, Take 1 tablet (20 mg total) by mouth 3 (three) times daily as needed., Disp: 90 tablet, Rfl: 3   Patient Care Team: Margaretann Loveless, PA-C as PCP - General (Family Medicine)    Objective:    Vitals: BP 122/82 (BP Location: Left Arm, Patient Position: Sitting, Cuff Size: Large)   Pulse 66   Temp 97.6 F (36.4 C) (Temporal)   Resp 16   Ht 5\' 3"  (1.6 m)   Wt 195 lb 9.6 oz (88.7 kg)   LMP 09/19/2019   BMI 34.65 kg/m    Vitals:   10/12/19 1407  BP: 122/82  Pulse: 66  Resp: 16  Temp: 97.6 F (36.4 C)  TempSrc: Temporal  Weight: 195 lb 9.6 oz (88.7 kg)  Height: 5\' 3"  (1.6 m)     Physical Exam Vitals reviewed.  Constitutional:      General: She is not in acute distress.    Appearance: Normal appearance. She is well-developed. She is obese. She is not ill-appearing or diaphoretic.  HENT:     Head: Normocephalic and atraumatic.     Right Ear: Tympanic membrane, ear canal and external ear normal.     Left Ear: Tympanic membrane, ear canal and external ear normal.     Nose: Nose normal.     Mouth/Throat:      Mouth: Mucous membranes are moist.     Pharynx: Oropharynx is clear. No oropharyngeal exudate.  Eyes:     General: No scleral icterus.       Right eye: No discharge.        Left eye: No discharge.     Extraocular Movements: Extraocular movements intact.     Conjunctiva/sclera: Conjunctivae normal.     Pupils: Pupils are equal, round, and reactive to light.  Neck:     Thyroid: No thyromegaly.     Vascular: No carotid bruit or JVD.  Trachea: No tracheal deviation.  Cardiovascular:     Rate and Rhythm: Normal rate and regular rhythm.     Pulses: Normal pulses.     Heart sounds: Normal heart sounds. No murmur. No friction rub. No gallop.   Pulmonary:     Effort: Pulmonary effort is normal. No respiratory distress.     Breath sounds: Normal breath sounds. No wheezing or rales.  Chest:     Chest wall: No tenderness.  Abdominal:     General: Abdomen is flat. Bowel sounds are normal. There is no distension.     Palpations: Abdomen is soft. There is no mass.     Tenderness: There is no abdominal tenderness. There is no guarding or rebound.  Musculoskeletal:        General: No tenderness. Normal range of motion.     Cervical back: Normal range of motion and neck supple.     Right lower leg: No edema.     Left lower leg: No edema.     Comments: Swelling and redness noted over multiple joints on both hands  Lymphadenopathy:     Cervical: No cervical adenopathy.  Skin:    General: Skin is warm and dry.     Capillary Refill: Capillary refill takes less than 2 seconds.     Findings: No rash.  Neurological:     General: No focal deficit present.     Mental Status: She is alert and oriented to person, place, and time. Mental status is at baseline.  Psychiatric:        Mood and Affect: Mood normal.        Behavior: Behavior normal.        Thought Content: Thought content normal.        Judgment: Judgment normal.      Depression Screen PHQ 2/9 Scores 10/12/2019 10/09/2018  05/16/2018  PHQ - 2 Score 0 0 0  PHQ- 9 Score - 0 -       Assessment & Plan:     Routine Health Maintenance and Physical Exam  Exercise Activities and Dietary recommendations Goals   None     Immunization History  Administered Date(s) Administered  . Td 10/09/2018  . Tdap 07/11/2008    Health Maintenance  Topic Date Due  . HIV Screening  11/22/1983  . COLONOSCOPY  11/21/2018  . INFLUENZA VACCINE  01/30/2020 (Originally 06/02/2019)  . MAMMOGRAM  10/31/2020  . PAP SMEAR-Modifier  10/09/2021  . TETANUS/TDAP  10/09/2028     Discussed health benefits of physical activity, and encouraged her to engage in regular exercise appropriate for her age and condition.    1. Annual physical exam Normal exam with exception of inflamed joints. Labs from 10/11/19 reviewed in room. Up to date on screenings and vaccinations.  2. Encounter for screening mammogram for malignant neoplasm of breast Breast exam today was normal. There is no family history of breast cancer. She does perform regular self breast exams. Mammogram was ordered as below. [facility] given to patient so she may schedule her mammogram at her convenience. - MM 3D SCREEN BREAST BILATERAL; Future  3. Colon cancer screening Due for colonoscopy. Referral placed as below.  - Ambulatory referral to Gastroenterology  4. Class 1 obesity due to excess calories with serious comorbidity and body mass index (BMI) of 34.0 to 34.9 in adult Counseled patient on healthy lifestyle modifications including dieting and exercise.   5. Pain in other joint Multiple joints red and  swollen and tender to move. Will check ANA and RF as below. Will f/u pending results.  - ANA,IFA RA Diag Pnl w/rflx Tit/Patn  --------------------------------------------------------------------    Margaretann Loveless, PA-C  [facility]

## 2019-10-16 LAB — ANA,IFA RA DIAG PNL W/RFLX TIT/PATN
ANA Titer 1: NEGATIVE
Cyclic Citrullin Peptide Ab: >250 units — ABNORMAL HIGH (ref 0–19)
Rheumatoid fact SerPl-aCnc: 87.3 IU/mL — ABNORMAL HIGH (ref 0.0–13.9)

## 2019-10-16 NOTE — Addendum Note (Signed)
Addended by: Mar Daring on: 10/16/2019 04:43 PM   Modules accepted: Orders

## 2019-10-18 ENCOUNTER — Other Ambulatory Visit: Payer: Self-pay

## 2019-10-18 ENCOUNTER — Telehealth: Payer: Self-pay

## 2019-10-18 DIAGNOSIS — Z1211 Encounter for screening for malignant neoplasm of colon: Secondary | ICD-10-CM

## 2019-10-18 MED ORDER — NA SULFATE-K SULFATE-MG SULF 17.5-3.13-1.6 GM/177ML PO SOLN: 354.0000 mL | Freq: Once | ORAL | 0 refills | Status: AC

## 2019-10-18 NOTE — Telephone Encounter (Signed)
Gastroenterology Pre-Procedure Review    PATIENT REVIEW QUESTIONS: The patient responded to the following health history questions as indicated:    1. Are you having any GI issues? no 2. Do you have a personal history of Polyps? yes (but was 20 years ago ) 3. Do you have a family history of Colon Cancer or Polyps? no 4. Diabetes Mellitus? no 5. Joint replacements in the past 12 months?no 6. Major health problems in the past 3 months?no 7. Any artificial heart valves, MVP, or defibrillator?no    MEDICATIONS & ALLERGIES:    Patient reports the following regarding taking any anticoagulation/antiplatelet therapy:   Plavix, Coumadin, Eliquis, Xarelto, Lovenox, Pradaxa, Brilinta, or Effient? no Aspirin? no  Patient confirms/reports the following medications:  Current Outpatient Medications  Medication Sig Dispense Refill  . cyclobenzaprine (FLEXERIL) 5 MG tablet Take 1 tablet (5 mg total) by mouth 3 (three) times daily as needed for muscle spasms. 30 tablet 1  . gabapentin (NEURONTIN) 100 MG capsule Take 1 capsule (100 mg total) by mouth at bedtime. 90 capsule 0  . meloxicam (MOBIC) 15 MG tablet TAKE 1 TABLET BY MOUTH DAILY 60 tablet 1  . metoprolol succinate (TOPROL-XL) 25 MG 24 hr tablet Take 1 tablet (25 mg total) by mouth daily. 30 tablet 0  . propranolol (INDERAL) 20 MG tablet Take 1 tablet (20 mg total) by mouth 3 (three) times daily as needed. 90 tablet 3   No current facility-administered medications for this visit.    Patient confirms/reports the following allergies:  No Known Allergies  No orders of the defined types were placed in this encounter.   AUTHORIZATION INFORMATION Primary Insurance: 1D#: Group #:  Secondary Insurance: 1D#: Group #:  SCHEDULE INFORMATION: Date:  Time: Location:

## 2019-11-05 ENCOUNTER — Telehealth: Payer: Self-pay | Admitting: Gastroenterology

## 2019-11-05 ENCOUNTER — Other Ambulatory Visit: Payer: Self-pay | Admitting: Physician Assistant

## 2019-11-05 ENCOUNTER — Other Ambulatory Visit: Payer: Self-pay

## 2019-11-05 ENCOUNTER — Ambulatory Visit
Admission: RE | Admit: 2019-11-05 | Discharge: 2019-11-05 | Disposition: A | Discharge status: Home / Self Care | Payer: Managed Care, Other (non HMO) | Source: Ambulatory Visit | Attending: Physician Assistant | Admitting: Physician Assistant

## 2019-11-05 DIAGNOSIS — Z1231 Encounter for screening mammogram for malignant neoplasm of breast: Secondary | ICD-10-CM | POA: Diagnosis present

## 2019-11-05 DIAGNOSIS — R928 Other abnormal and inconclusive findings on diagnostic imaging of breast: Secondary | ICD-10-CM

## 2019-11-05 DIAGNOSIS — N6320 Unspecified lump in the left breast, unspecified quadrant: Secondary | ICD-10-CM

## 2019-11-05 NOTE — Telephone Encounter (Signed)
Returned patients call.  She would like to cancel her colonoscopy due to other things going on.  She will call back to reschedule at a later time.  Vikki in Endo notified.  Thanks [employer]

## 2019-11-05 NOTE — Telephone Encounter (Signed)
Pt left vm to cancel her procedure for 11/12/19 please call pt

## 2019-11-06 ENCOUNTER — Encounter: Payer: Self-pay | Admitting: Physician Assistant

## 2019-11-12 ENCOUNTER — Ambulatory Visit: Admit: 2019-11-12 | Payer: Managed Care, Other (non HMO) | Admitting: Gastroenterology

## 2019-11-12 SURGERY — COLONOSCOPY WITH PROPOFOL
Anesthesia: General

## 2019-11-15 ENCOUNTER — Ambulatory Visit
Admission: RE | Admit: 2019-11-15 | Discharge: 2019-11-15 | Disposition: A | Discharge status: Home / Self Care | Payer: Managed Care, Other (non HMO) | Source: Ambulatory Visit | Attending: Physician Assistant | Admitting: Physician Assistant

## 2019-11-15 ENCOUNTER — Telehealth: Payer: Self-pay

## 2019-11-15 DIAGNOSIS — R928 Other abnormal and inconclusive findings on diagnostic imaging of breast: Secondary | ICD-10-CM

## 2019-11-15 DIAGNOSIS — N6320 Unspecified lump in the left breast, unspecified quadrant: Secondary | ICD-10-CM | POA: Diagnosis present

## 2019-11-15 NOTE — Telephone Encounter (Signed)
-----   Message from Margaretann Loveless, [place] sent at 11/15/2019  1:44 PM EST ----- Diagnostic and US showed cyst in left breast. Ok to return to normal screenings.

## 2019-11-15 NOTE — Telephone Encounter (Signed)
   Result Notes and Comments to Patient Comment seen by patient Carly Stanley on 11/15/2019 2:42 PM EST

## 2019-11-26 ENCOUNTER — Telehealth: Payer: Self-pay

## 2019-11-26 NOTE — Telephone Encounter (Signed)
Copied from CRM [id number]. Topic: Appointment Scheduling - Scheduling Inquiry for Clinic >> Nov 26, 2019 11:25 AM Dalphine Handing A wrote: Patient wants to know if Victorino Dike can see her within the next 3 days for her swollen lymph node she sent her . Patient cannot come in on Friday due to work and would like to know if there is anyway she can be worked in. Patient only wants to be seen by [facility]. Best contact [phone]

## 2019-11-26 NOTE — Telephone Encounter (Signed)
Patient scheduled for Thursday

## 2019-11-29 ENCOUNTER — Ambulatory Visit (INDEPENDENT_AMBULATORY_CARE_PROVIDER_SITE_OTHER): Payer: Managed Care, Other (non HMO) | Admitting: Physician Assistant

## 2019-11-29 ENCOUNTER — Encounter: Payer: Self-pay | Admitting: Physician Assistant

## 2019-11-29 ENCOUNTER — Other Ambulatory Visit: Payer: Self-pay

## 2019-11-29 VITALS — BP 107/76 | HR 67 | Temp 97.7°F | Resp 16 | Wt 197.6 lb

## 2019-11-29 DIAGNOSIS — B029 Zoster without complications: Secondary | ICD-10-CM

## 2019-11-29 DIAGNOSIS — R590 Localized enlarged lymph nodes: Secondary | ICD-10-CM

## 2019-11-29 MED ORDER — VALACYCLOVIR HCL 1 G PO TABS: 1000.0000 mg | ORAL_TABLET | Freq: Three times a day (TID) | ORAL | 0 refills | Status: DC

## 2019-11-29 NOTE — Progress Notes (Signed)
Patient: Carly Stanley Female    DOB: [birth year]   49 y.o.   MRN: [id number] Visit Date: 11/29/2019  Today's Provider: Margaretann Loveless, PA-C   Chief Complaint  Patient presents with  . Adenopathy   Subjective:     HPI  Patient here today with c/o swollen lymph node, left clavicle area. She noticed them the first week of January. Report that the swelling is much better, but not the pain. This occurred approx 1 week after her Covid-19 vaccination. She also mentions some swelling noted in the left axilla as well. Same arm for vaccination.   She is also having some pain start under the left shoulder blade with pins and needles sensation up to the left upper chest area. This is close to same area where she previously had shingles. No rash currently.    No Known Allergies   Current Outpatient Medications:  .  folic acid (FOLVITE) 1 MG tablet, Take by mouth., Disp: , Rfl:  .  ibuprofen (ADVIL) 200 MG tablet, Take by mouth., Disp: , Rfl:  .  methotrexate (RHEUMATREX) 2.5 MG tablet, 4 tabs =10 mg weekly, 16 tabs for 4 weeks, Disp: , Rfl:  .  metoprolol succinate (TOPROL-XL) 25 MG 24 hr tablet, Take 1 tablet (25 mg total) by mouth daily., Disp: 30 tablet, Rfl: 0 .  propranolol (INDERAL) 20 MG tablet, Take 1 tablet (20 mg total) by mouth 3 (three) times daily as needed., Disp: 90 tablet, Rfl: 3 .  cyclobenzaprine (FLEXERIL) 5 MG tablet, Take 1 tablet (5 mg total) by mouth 3 (three) times daily as needed for muscle spasms. (Patient not taking: Reported on 11/29/2019), Disp: 30 tablet, Rfl: 1  Review of Systems  Constitutional: Negative.   Respiratory: Negative.   Cardiovascular: Negative.   Musculoskeletal: Positive for back pain.  Neurological: Positive for numbness.  Hematological: Positive for adenopathy.    Social History   Tobacco Use  . Smoking status: Never Smoker  . Smokeless tobacco: Never Used  Substance Use Topics  . Alcohol use: No      Objective:   BP  107/76 (BP Location: Left Arm, Patient Position: Sitting, Cuff Size: Large)   Pulse 67   Temp 97.7 F (36.5 C) (Temporal)   Resp 16   Wt 197 lb 9.6 oz (89.6 kg)   LMP 11/10/2019   BMI 35.00 kg/m  Vitals:   11/29/19 1353  BP: 107/76  Pulse: 67  Resp: 16  Temp: 97.7 F (36.5 C)  TempSrc: Temporal  Weight: 197 lb 9.6 oz (89.6 kg)  Body mass index is 35 kg/m.   Physical Exam Vitals reviewed.  Constitutional:      General: She is not in acute distress.    Appearance: Normal appearance. She is well-developed. She is obese. She is not ill-appearing or diaphoretic.  Neck:     Thyroid: No thyromegaly.     Vascular: No JVD.     Trachea: No tracheal deviation.  Cardiovascular:     Rate and Rhythm: Normal rate and regular rhythm.     Heart sounds: Normal heart sounds. No murmur. No friction rub. No gallop.   Pulmonary:     Effort: Pulmonary effort is normal. No respiratory distress.     Breath sounds: Normal breath sounds. No wheezing or rales.  Musculoskeletal:     Cervical back: Normal, normal range of motion and neck supple.     Thoracic back: Normal.       Back:  Lymphadenopathy:     Cervical: No cervical adenopathy.     Upper Body:     Left upper body: Supraclavicular adenopathy and axillary adenopathy present.  Neurological:     Mental Status: She is alert.      No results found for any visits on 11/29/19.     Assessment & Plan    1. Lymphadenopathy, supraclavicular Suspect secondary to covid 19 vaccination, but since still present after 3 weeks will get [place] as below to r/o concerning features. I will f/u pending results.  - US Soft Tissue Head/Neck; Future  2. Lymphadenopathy, axillary Patient reports some swelling in axilla without definitive lymph node present. Recently diagnosed with left breast cyst and she wants [place] of axilla to make sure no enlarged lymph nodes. See above medical treatment plan. - [place] AXILLA LEFT; Future  3. Herpes zoster without  complication Suspect back pain may be recurrence of shingles vs post herpetic neuralgia since there is no rash. However, the latter is lower on my differential due to patient having symptom resolution after completing treatment before and then symptoms returned just recently. Will treat with Valtrex as below. If not improving she is to call the office and may prescribe gabapentin for PHN.  - valACYclovir (VALTREX) 1000 MG tablet; Take 1 tablet (1,000 mg total) by mouth 3 (three) times daily.  Dispense: 21 tablet; Refill: 0     Mar Daring, PA-C  [facility]

## 2019-11-30 ENCOUNTER — Encounter: Payer: Self-pay | Admitting: Physician Assistant

## 2019-11-30 NOTE — Patient Instructions (Signed)

## 2019-12-05 ENCOUNTER — Ambulatory Visit: Payer: Managed Care, Other (non HMO)

## 2019-12-06 ENCOUNTER — Other Ambulatory Visit: Payer: Self-pay

## 2019-12-06 ENCOUNTER — Ambulatory Visit
Admission: RE | Admit: 2019-12-06 | Discharge: 2019-12-06 | Disposition: A | Discharge status: Home / Self Care | Payer: Managed Care, Other (non HMO) | Source: Ambulatory Visit | Attending: Physician Assistant | Admitting: Physician Assistant

## 2019-12-06 ENCOUNTER — Telehealth: Payer: Self-pay

## 2019-12-06 DIAGNOSIS — R590 Localized enlarged lymph nodes: Secondary | ICD-10-CM

## 2019-12-06 NOTE — Telephone Encounter (Signed)
-----   Message from Margaretann Loveless, [place] sent at 12/06/2019 10:47 AM EST ----- US neck was normal. No abnormal lymph node noted.

## 2019-12-06 NOTE — Telephone Encounter (Signed)
Patient advised as below.  

## 2019-12-06 NOTE — Telephone Encounter (Signed)
-----   Message from Margaretann Loveless, [place] sent at 12/06/2019 12:57 PM EST ----- Left axillary [place] also negative for any lymphadenopathy or other mass.

## 2020-01-28 ENCOUNTER — Other Ambulatory Visit: Payer: Self-pay | Admitting: Physician Assistant

## 2020-01-28 DIAGNOSIS — R002 Palpitations: Secondary | ICD-10-CM

## 2020-04-04 ENCOUNTER — Encounter: Payer: Self-pay | Admitting: Physician Assistant

## 2020-04-04 ENCOUNTER — Telehealth: Payer: Self-pay

## 2020-04-04 DIAGNOSIS — R3989 Other symptoms and signs involving the genitourinary system: Secondary | ICD-10-CM

## 2020-04-04 DIAGNOSIS — N3000 Acute cystitis without hematuria: Secondary | ICD-10-CM

## 2020-04-04 MED ORDER — SULFAMETHOXAZOLE-TRIMETHOPRIM 800-160 MG PO TABS: 1.0000 | ORAL_TABLET | Freq: Two times a day (BID) | ORAL | 0 refills | Status: DC

## 2020-04-04 MED ORDER — CEPHALEXIN 500 MG PO CAPS: 500.0000 mg | ORAL_CAPSULE | Freq: Two times a day (BID) | ORAL | 0 refills | Status: DC

## 2020-04-04 NOTE — Telephone Encounter (Signed)
OK I will change antibiotic

## 2020-04-04 NOTE — Telephone Encounter (Signed)
Copied from CRM [id number]. Topic: General - Other >> Apr 04, 2020 11:52 AM [name] wrote: Reason for CRM: Patient called back for Carly Stanley nurse states that she is still taking the methotrexate (RHEUMATREX) 2.5 MG tablet she will contact the pharmacy also .

## 2020-04-23 ENCOUNTER — Other Ambulatory Visit: Payer: Self-pay

## 2020-04-23 ENCOUNTER — Encounter: Payer: Self-pay | Admitting: Physician Assistant

## 2020-04-23 ENCOUNTER — Ambulatory Visit: Payer: Managed Care, Other (non HMO) | Admitting: Physician Assistant

## 2020-04-23 VITALS — BP 100/80 | HR 74 | Ht 62.0 in | Wt 188.1 lb

## 2020-04-23 DIAGNOSIS — R002 Palpitations: Secondary | ICD-10-CM | POA: Diagnosis not present

## 2020-04-23 DIAGNOSIS — I491 Atrial premature depolarization: Secondary | ICD-10-CM

## 2020-04-23 NOTE — Progress Notes (Signed)
Office Visit    Patient Name: Carly Stanley Date of Encounter: 04/23/2020  Primary Care Provider:  Margaretann Loveless, PA-C Primary Cardiologist:  Dr. Mariah Milling  Chief Complaint    Chief Complaint  Patient presents with  . office visit    F/U appointment-Patient reports frequent palpitations for the last 2-3 weeks; Meds verbally reviewed with patient.    49 year old female with PMH significant for palpitations, prediabetes, and here today for follow-up.  Past Medical History    Past Medical History:  Diagnosis Date  . PAC (premature atrial contraction)   . Pre-diabetes 2016   Past Surgical History:  Procedure Laterality Date  . CERVICAL DISC SURGERY  2016  . CHOLECYSTECTOMY    . OVARIAN CYST DRAINAGE      Allergies  No Known Allergies  History of Present Illness    Carly Stanley is a 49 y.o. female with PMH as above.  She established with [facility] cardiology 11/29/2017.  At that time, she reported waxing and waning palpitations over the past 6 months.  She also felt as if the palpitations were becoming more frequent in occurrence, reporting daily occurrence.  The palpitations were most noted as a fluttering up in her throat.  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  She reported that occasionally she also noted shortness of breath.  Recommendation at that time was to start Bystolic 5 mg daily with propanolol 10/20 mg as needed for breakthrough palpitations.  Today, she returns to clinic and reports ongoing symptoms of palpitations, increasing in frequency over the last 2 to 3 weeks.  She continues to report that these palpitations are best appreciated in her neck as a fluttering.  he reports that she can at times feel her heart pounding.  This past Monday, she reports feeling her heart pounding/palpitations for approximately 5 to 10 minutes.  She  attempted a vagal maneuver without success.ith her telemetry strips that show previously noted ectopy, including PACs and PVCs. She denies any presyncope or syncope.  No signs or symptoms of worsening heart failure.  She does note an irregular sleeping pattern/shiftwork sleeping due to her job.  She also reports charley horses in her legs.  Also, of note, she has recently noted supraclavicular nodes that are painful and swollen with PCP performing work-up and thus far without significant findings.  She notes a recent diagnosis of rheumatoid arthritis and states that she recently had to go on Humira 40 mg every 2 weeks.  She continues to use ibuprofen each day, estimating 3 pills in the a.m. and evening.  She is no longer on Bystolic and reports Toprol-XL 25 mg daily with propanolol for breakthru sx, which is not completely alleviating her symptoms.  She denies any signs or symptoms of bleeding.  She reports medication compliance.  She denies excessive alcohol or caffeine use.  No recent cold medication.  Home Medications    Prior to Admission medications   Medication Sig Start Date End Date Taking? Authorizing Provider  Adalimumab (HUMIRA PEN [place]) 1 injection every 2 weeks   Yes [provider]  folic acid (FOLVITE) 1 MG tablet Take 1 mg by mouth daily.  10/29/19 10/28/20 Yes [provider]  ibuprofen (ADVIL) 200 MG tablet Take 200 mg by mouth daily.    Yes [provider]  methotrexate (RHEUMATREX) 2.5 MG tablet Take 20  mg by mouth once a week. Caution:Chemotherapy. Protect from light.   Yes [provider]  metoprolol succinate (TOPROL-XL) 25 MG 24 hr tablet Take 1 tablet (25 mg total) by mouth daily. 10/09/18  Yes Mar Daring, PA-C  propranolol (INDERAL) 20 MG tablet TAKE ONE TABLET 3 TIMES DAILY AS NEEDED 01/28/20  Yes Mar Daring, PA-C  cephALEXin (KEFLEX) 500 MG capsule Take 1 capsule (500 mg total) by mouth 2 (two) times daily. 04/04/20   Mar Daring, PA-C  cyclobenzaprine (FLEXERIL) 5 MG tablet Take 1 tablet (5 mg total) by mouth 3 (three) times daily as needed for muscle spasms. Patient not taking: Reported on 11/29/2019 06/23/18   Mar Daring, PA-C  methotrexate (RHEUMATREX) 2.5 MG tablet 4 tabs =10 mg weekly, 16 tabs for 4 weeks 10/29/19   [provider]  valACYclovir (VALTREX) 1000 MG tablet Take 1 tablet (1,000 mg total) by mouth 3 (three) times daily. 11/29/19   Mar Daring, PA-C    Review of Systems    She denies chest pain, dyspnea, pnd, orthopnea, n, v, dizziness, syncope, edema, weight gain, or early satiety. She reports palpitations and leg cramps, as well as swollen supraclavicular nodes.   All other systems reviewed and are otherwise negative except as noted above.  Physical Exam    VS:  BP 100/80 (BP Location: Left Arm, Patient Position: Sitting, Cuff Size: Large)   Pulse 74   Ht 5\' 2"  (1.575 m)   Wt 188 lb 2 oz (85.3 kg)   SpO2 97%   BMI 34.41 kg/m  , BMI Body mass index is 34.41 kg/m. GEN: Well nourished, well developed, in no acute distress. HEENT: normal. Neck: Supple, no JVD, carotid bruits, or masses. Cardiac: RRR, no murmurs, rubs, or gallops. No clubbing, cyanosis, pitting edema.  Radials/DP/PT 2+ and equal bilaterally.  Respiratory:  Respirations regular and unlabored, clear to auscultation bilaterally. GI: Soft, nontender, nondistended, BS + x 4. MS: no deformity or atrophy. Skin: warm and dry, no rash. Neuro:  Strength and sensation are intact. Psych: Normal affect.   Accessory Clinical Findings    ECG personally reviewed by me today - NSR, 74 bpm, poor conduction in leads III and VL likely due to lead placement- no acute changes.  VITALS Reviewed today   Temp Readings from Last 3 Encounters:  11/29/19 97.7 F (36.5 C) (Temporal)  10/12/19 97.6 F (36.4 C) (Temporal)  10/09/18 98.2 F (36.8 C) (Oral)   BP Readings from Last 3 Encounters:  04/23/20 100/80    11/29/19 107/76  10/12/19 122/82   Pulse Readings from Last 3 Encounters:  04/23/20 74  11/29/19 67  10/12/19 66    Wt Readings from Last 3 Encounters:  04/23/20 188 lb 2 oz (85.3 kg)  11/29/19 197 lb 9.6 oz (89.6 kg)  10/12/19 195 lb 9.6 oz (88.7 kg)     LABS  reviewed today  CareEverywhere labs reviewed Lab Results  Component Value Date   WBC 7.5 10/11/2019   HGB 12.6 10/11/2019   HCT 37.7 10/11/2019   MCV 89 10/11/2019   PLT 336 10/11/2019   Lab Results  Component Value Date   CREATININE 0.82 10/11/2019   BUN 13 10/11/2019   NA 139 10/11/2019   K 4.3 10/11/2019   CL 105 10/11/2019   CO2 18 (L) 10/11/2019   Lab Results  Component Value Date   ALT 16 10/11/2019   AST 17 10/11/2019   ALKPHOS 72 10/11/2019  BILITOT 0.4 10/11/2019   Lab Results  Component Value Date   CHOL 146 10/11/2019   HDL 53 10/11/2019   LDLCALC 81 10/11/2019   TRIG 59 10/11/2019   CHOLHDL 2.8 10/11/2019    Lab Results  Component Value Date   HGBA1C 5.5 10/11/2019   Lab Results  Component Value Date   TSH 2.740 10/11/2019     STUDIES/PROCEDURES reviewed today   Pending echo  Assessment & Plan    Palpitations --Consider that may be having episodes of SVT not yet caught on telemetry strips.  Telemetry strips consistent with previous ectopy noted per review of previous progress note. Discussed possible Zio with patient preference to defer for now as she can always obtain telemetry strips through her job. Reassess at RTC. --Will plan for echo to assess EF and rule out any acute structural normalities.  Also discussed that echocardiogram would be a reasonable study, given her recent diagnosis of rheumatoid arthritis.  Discussed changing from metoprolol succinate to diltiazem if ejection fraction normal on echo, as suspect this will give her more relief from her symptoms. If echo is without significant findings, and ongoing sx, consider ZIO monitoring. If EF reduced on echocardiogram,  will need further ischemic work-up with stress test +/-  cardiac catheterization.  --Most recent labs show stable renal function, liver function, hemoglobin, thyroid, and electrolytes. -- Discussed current symptoms could be exacerbated by her shift work and abnormal sleep cycle. Discussed also medication side effects as well as recently noted supraclavicular nodes with workup ongoing but no significant findings as of yet. Heart healthy diet and lifestyle recommended. Future considerations could include cortisol levels and metanephrine work-up. Monitor supraclavicular node workup.    Medication changes: Defer for now.  Consider diltiazem in place of Toprol  if EF normal. Can still [place] panel for breakthrough symptoms. Labs ordered: None.  Recent labs WNL. Studies / Imaging ordered: Echo  Future considerations: ?Zio,, cortisol, metanephrine work-up Disposition: RTC in 5 weeks or after echo    Lennon Alstrom, PA-C 04/23/2020

## 2020-04-23 NOTE — Patient Instructions (Signed)
Medication Instructions:   Your physician recommends that you continue on your current medications as directed. Please refer to the Current Medication list given to you today.  Marisue Ivan, PA-C will call you with medication recommendations after your    echo results.   *If you need a refill on your cardiac medications before your next appointment, please call your pharmacy*   Lab Work: None Ordered. If you have labs (blood work) drawn today and your tests are completely normal, you will receive your results only by: Marland Kitchen MyChart Message (if you have MyChart) OR . A paper copy in the mail If you have any lab test that is abnormal or we need to change your treatment, we will call you to review the results.   Testing/Procedures:  Your physician has requested that you have an echocardiogram. Echocardiography is a painless test that uses sound waves to create images of your heart. It provides your doctor with information about the size and shape of your heart and how well your heart's chambers and valves are working. This procedure takes approximately one hour. There are no restrictions for this procedure.   Follow-Up: At [facility], you and your health needs are our priority.  As part of our continuing mission to provide you with exceptional heart care, we have created designated Provider Care Teams.  These Care Teams include your primary Cardiologist (physician) and Advanced Practice Providers (APPs -  Physician Assistants and Nurse Practitioners) who all work together to provide you with the care you need, when you need it.  We recommend signing up for the patient portal called "MyChart".  Sign up information is provided on this After Visit Summary.  MyChart is used to connect with patients for Virtual Visits (Telemedicine).  Patients are able to view lab/test results, encounter notes, upcoming appointments, etc.  Non-urgent messages can be sent to your provider as well.   To learn  more about what you can do with MyChart, go to [email or web address].    Your next appointment:   Follow as needed.   The format for your next appointment:   In Person  Provider:    You may see No primary care provider on file. or one of the following Advanced Practice Providers on your designated Care Team:    Nicolasa Ducking, NP  Eula Listen, PA-C  Marisue Ivan, PA-C  Gillian Shields, NP    Other Instructions   Echocardiogram An echocardiogram is a procedure that uses painless sound waves (ultrasound) to produce an image of the heart. Images from an echocardiogram can provide important information about:  Signs of coronary artery disease (CAD).  Aneurysm detection. An aneurysm is a weak or damaged part of an artery wall that bulges out from the normal force of blood pumping through the body.  Heart size and shape. Changes in the size or shape of the heart can be associated with certain conditions, including heart failure, aneurysm, and CAD.  Heart muscle function.  Heart valve function.  Signs of a past heart attack.  Fluid buildup around the heart.  Thickening of the heart muscle.  A tumor or infectious growth around the heart valves. Tell a health care provider about:  Any allergies you have.  All medicines you are taking, including vitamins, herbs, eye drops, creams, and over-the-counter medicines.  Any blood disorders you have.  Any surgeries you have had.  Any medical conditions you have.  Whether you are pregnant or may be pregnant. What are the risks? Generally,  this is a safe procedure. However, problems may occur, including:  Allergic reaction to dye (contrast) that may be used during the procedure. What happens before the procedure? No specific preparation is needed. You may eat and drink normally. What happens during the procedure?   An IV tube may be inserted into one of your veins.  You may receive contrast through this tube. A  contrast is an injection that improves the quality of the pictures from your heart.  A gel will be applied to your chest.  A wand-like tool (transducer) will be moved over your chest. The gel will help to transmit the sound waves from the transducer.  The sound waves will harmlessly bounce off of your heart to allow the heart images to be captured in real-time motion. The images will be recorded on a computer. The procedure may vary among health care providers and hospitals. What happens after the procedure?  You may return to your normal, everyday life, including diet, activities, and medicines, unless your health care provider tells you not to do that. Summary  An echocardiogram is a procedure that uses painless sound waves (ultrasound) to produce an image of the heart.  Images from an echocardiogram can provide important information about the size and shape of your heart, heart muscle function, heart valve function, and fluid buildup around your heart.  You do not need to do anything to prepare before this procedure. You may eat and drink normally.  After the echocardiogram is completed, you may return to your normal, everyday life, unless your health care provider tells you not to do that. This information is not intended to replace advice given to you by your health care provider. Make sure you discuss any questions you have with your health care provider. Document Revised: 02/08/2019 Document Reviewed: 11/20/2016 Elsevier Patient Education  [place].

## 2020-05-01 ENCOUNTER — Other Ambulatory Visit: Payer: Self-pay

## 2020-05-01 ENCOUNTER — Ambulatory Visit (INDEPENDENT_AMBULATORY_CARE_PROVIDER_SITE_OTHER): Payer: Managed Care, Other (non HMO)

## 2020-05-01 DIAGNOSIS — R002 Palpitations: Secondary | ICD-10-CM

## 2020-06-19 ENCOUNTER — Telehealth: Payer: Self-pay

## 2020-06-19 ENCOUNTER — Encounter (INDEPENDENT_AMBULATORY_CARE_PROVIDER_SITE_OTHER): Payer: Managed Care, Other (non HMO) | Admitting: Physician Assistant

## 2020-06-19 DIAGNOSIS — R3989 Other symptoms and signs involving the genitourinary system: Secondary | ICD-10-CM | POA: Diagnosis not present

## 2020-06-19 LAB — POCT URINALYSIS DIPSTICK
Bilirubin, UA: NEGATIVE
Blood, UA: NEGATIVE
Glucose, UA: NEGATIVE
Ketones, UA: NEGATIVE
Leukocytes, UA: NEGATIVE
Nitrite, UA: NEGATIVE
Protein, UA: NEGATIVE
Spec Grav, UA: 1.01 (ref 1.010–1.025)
Urobilinogen, UA: 0.2 E.U./dL
pH, UA: 7.5 (ref 5.0–8.0)

## 2020-06-19 NOTE — Telephone Encounter (Signed)
Do you need the urine to go for UCx? It was negative.

## 2020-06-19 NOTE — Telephone Encounter (Signed)
-----   Message from Margaretann Loveless, [place] sent at 06/19/2020  5:03 PM EDT ----- Urine appears to be completely normal.  No signs of UTI. Can still send for culture to see if any bacteria grow out.

## 2020-06-19 NOTE — Telephone Encounter (Signed)
Seen by patient Carly Stanley on 06/19/2020 6:00 PM

## 2020-06-19 NOTE — Addendum Note (Signed)
Addended by: Margaretann Loveless on: 06/19/2020 05:08 PM   Modules accepted: Orders

## 2020-06-19 NOTE — Addendum Note (Signed)
Addended by: Marjie Skiff on: 06/19/2020 04:21 PM   Modules accepted: Orders

## 2020-06-22 LAB — URINE CULTURE

## 2020-06-23 ENCOUNTER — Telehealth: Payer: Self-pay

## 2020-06-23 NOTE — Telephone Encounter (Signed)
Patient advised as directed below. 

## 2020-06-23 NOTE — Telephone Encounter (Signed)
-----   Message from Margaretann Loveless, [place] sent at 06/23/2020  8:42 AM EDT ----- Urine culture is negative for bacterial growth.

## 2020-08-08 ENCOUNTER — Encounter: Payer: Self-pay | Admitting: Physician Assistant

## 2020-08-11 ENCOUNTER — Other Ambulatory Visit: Payer: Self-pay

## 2020-08-11 ENCOUNTER — Ambulatory Visit: Payer: Managed Care, Other (non HMO) | Admitting: Physician Assistant

## 2020-08-11 ENCOUNTER — Encounter: Payer: Self-pay | Admitting: Physician Assistant

## 2020-08-11 VITALS — BP 123/81 | HR 76 | Temp 98.2°F | Resp 16 | Wt 199.6 lb

## 2020-08-11 DIAGNOSIS — B9689 Other specified bacterial agents as the cause of diseases classified elsewhere: Secondary | ICD-10-CM | POA: Diagnosis not present

## 2020-08-11 DIAGNOSIS — N760 Acute vaginitis: Secondary | ICD-10-CM | POA: Diagnosis not present

## 2020-08-11 DIAGNOSIS — R300 Dysuria: Secondary | ICD-10-CM | POA: Diagnosis not present

## 2020-08-11 DIAGNOSIS — R3989 Other symptoms and signs involving the genitourinary system: Secondary | ICD-10-CM

## 2020-08-11 DIAGNOSIS — N3281 Overactive bladder: Secondary | ICD-10-CM | POA: Diagnosis not present

## 2020-08-11 LAB — POCT URINALYSIS DIPSTICK
Bilirubin, UA: NEGATIVE
Blood, UA: NEGATIVE
Glucose, UA: NEGATIVE
Ketones, UA: NEGATIVE
Leukocytes, UA: NEGATIVE
Nitrite, UA: NEGATIVE
Protein, UA: NEGATIVE
Spec Grav, UA: <=1.005 — AB (ref 1.010–1.025)
Urobilinogen, UA: 0.2 E.U./dL
pH, UA: 6.5 (ref 5.0–8.0)

## 2020-08-11 MED ORDER — METRONIDAZOLE 500 MG PO TABS: 500.0000 mg | ORAL_TABLET | Freq: Two times a day (BID) | ORAL | 0 refills | Status: DC

## 2020-08-11 NOTE — Patient Instructions (Signed)
Bacterial Vaginosis  Bacterial vaginosis is a vaginal infection that occurs when the normal balance of bacteria in the vagina is disrupted. It results from an overgrowth of certain bacteria. This is the most common vaginal infection among women ages 71-44. Because bacterial vaginosis increases your risk for STIs (sexually transmitted infections), getting treated can help reduce your risk for chlamydia, gonorrhea, herpes, and HIV (human immunodeficiency virus). Treatment is also important for preventing complications in pregnant women, because this condition can cause an early (premature) delivery. What are the causes? This condition is caused by an increase in harmful bacteria that are normally present in small amounts in the vagina. However, the reason that the condition develops is not fully understood. What increases the risk? The following factors may make you more likely to develop this condition:  Having a new sexual partner or multiple sexual partners.  Having unprotected sex.  Douching.  Having an intrauterine device (IUD).  Smoking.  Drug and alcohol abuse.  Taking certain antibiotic medicines.  Being pregnant. You cannot get bacterial vaginosis from toilet seats, bedding, swimming pools, or contact with objects around you. What are the signs or symptoms? Symptoms of this condition include:  Grey or white vaginal discharge. The discharge can also be watery or foamy.  A fish-like odor with discharge, especially after sexual intercourse or during menstruation.  Itching in and around the vagina.  Burning or pain with urination. Some women with bacterial vaginosis have no signs or symptoms. How is this diagnosed? This condition is diagnosed based on:  Your medical history.  A physical exam of the vagina.  Testing a sample of vaginal fluid under a microscope to look for a large amount of bad bacteria or abnormal cells. Your health care provider may use a cotton swab or  a small wooden spatula to collect the sample. How is this treated? This condition is treated with antibiotics. These may be given as a pill, a vaginal cream, or a medicine that is put into the vagina (suppository). If the condition comes back after treatment, a second round of antibiotics may be needed. Follow these instructions at home: Medicines  Take over-the-counter and prescription medicines only as told by your health care provider.  Take or use your antibiotic as told by your health care provider. Do not stop taking or using the antibiotic even if you start to feel better. General instructions  If you have a female sexual partner, tell her that you have a vaginal infection. She should see her health care provider and be treated if she has symptoms. If you have a female sexual partner, he does not need treatment.  During treatment: ? Avoid sexual activity until you finish treatment. ? Do not douche. ? Avoid alcohol as directed by your health care provider. ? Avoid breastfeeding as directed by your health care provider.  Drink enough water and fluids to keep your urine clear or pale yellow.  Keep the area around your vagina and rectum clean. ? Wash the area daily with warm water. ? Wipe yourself from front to back after using the toilet.  Keep all follow-up visits as told by your health care provider. This is important. How is this prevented?  Do not douche.  Wash the outside of your vagina with warm water only.  Use protection when having sex. This includes latex condoms and dental dams.  Limit how many sexual partners you have. To help prevent bacterial vaginosis, it is best to have sex with just one partner (  monogamous).  Make sure you and your sexual partner are tested for STIs.  Wear cotton or cotton-lined underwear.  Avoid wearing tight pants and pantyhose, especially during summer.  Limit the amount of alcohol that you drink.  Do not use any products that contain  nicotine or tobacco, such as cigarettes and e-cigarettes. If you need help quitting, ask your health care provider.  Do not use illegal drugs. Where to find more information  Centers for Disease Control and Prevention: SolutionApps.co.za  American Sexual Health Association (ASHA): www.ashastd.org  U.S. Department of Health and Health and safety inspector, Office on Women's Health: ConventionalMedicines.si or http://www.anderson-williamson.info/ Contact a health care provider if:  Your symptoms do not improve, even after treatment.  You have more discharge or pain when urinating.  You have a fever.  You have pain in your abdomen.  You have pain during sex.  You have vaginal bleeding between periods. Summary  Bacterial vaginosis is a vaginal infection that occurs when the normal balance of bacteria in the vagina is disrupted.  Because bacterial vaginosis increases your risk for STIs (sexually transmitted infections), getting treated can help reduce your risk for chlamydia, gonorrhea, herpes, and HIV (human immunodeficiency virus). Treatment is also important for preventing complications in pregnant women, because the condition can cause an early (premature) delivery.  This condition is treated with antibiotic medicines. These may be given as a pill, a vaginal cream, or a medicine that is put into the vagina (suppository). This information is not intended to replace advice given to you by your health care provider. Make sure you discuss any questions you have with your health care provider. Document Revised: 09/30/2017 Document Reviewed: 07/03/2016 Elsevier Patient Education  2020 Elsevier Inc.   Overactive Bladder, Adult  Overactive bladder refers to a condition in which a person has a sudden need to pass urine. The person may leak urine if he or she cannot get to the bathroom fast enough (urinary incontinence). A person with this condition may also wake up several times in  the night to go to the bathroom. Overactive bladder is associated with poor nerve signals between your bladder and your brain. Your bladder may get the signal to empty before it is full. You may also have very sensitive muscles that make your bladder squeeze too soon. These symptoms might interfere with daily work or social activities. What are the causes? This condition may be associated with or caused by:  Urinary tract infection.  Infection of nearby tissues, such as the prostate.  Prostate enlargement.  Surgery on the uterus or urethra.  Bladder stones, inflammation, or tumors.  Drinking too much caffeine or alcohol.  Certain medicines, especially medicines that get rid of extra fluid in the body (diuretics).  Muscle or nerve weakness, especially from: ? A spinal cord injury. ? Stroke. ? Multiple sclerosis. ? Parkinson's disease.  Diabetes.  Constipation. What increases the risk? You may be at greater risk for overactive bladder if you:  Are an older adult.  Smoke.  Are going through menopause.  Have prostate problems.  Have a neurological disease, such as stroke, dementia, Parkinson's disease, or multiple sclerosis (MS).  Eat or drink things that irritate the bladder. These include alcohol, spicy food, and caffeine.  Are overweight or obese. What are the signs or symptoms? Symptoms of this condition include:  Sudden, strong urge to urinate.  Leaking urine.  Urinating 8 or more times a day.  Waking up to urinate 2 or more times a night. How is  this diagnosed? Your health care provider may suspect overactive bladder based on your symptoms. He or she will diagnose this condition by:  A physical exam and medical history.  Blood or urine tests. You might need bladder or urine tests to help determine what is causing your overactive bladder. You might also need to see a health care provider who specializes in urinary tract problems (urologist). How is this  treated? Treatment for overactive bladder depends on the cause of your condition and whether it is mild or severe. You can also make lifestyle changes at home. Options include:  Bladder training. This may include: ? Learning to control the urge to urinate by following a schedule that directs you to urinate at regular intervals (timed voiding). ? Doing Kegel exercises to strengthen your pelvic floor muscles, which support your bladder. Toning these muscles can help you control urination, even if your bladder muscles are overactive.  Special devices. This may include: ? Biofeedback, which uses sensors to help you become aware of your body's signals. ? Electrical stimulation, which uses electrodes placed inside the body (implanted) or outside the body. These electrodes send gentle pulses of electricity to strengthen the nerves or muscles that control the bladder. ? Women may use a plastic device that fits into the vagina and supports the bladder (pessary).  Medicines. ? Antibiotics to treat bladder infection. ? Antispasmodics to stop the bladder from releasing urine at the wrong time. ? Tricyclic antidepressants to relax bladder muscles. ? Injections of botulinum toxin type A directly into the bladder tissue to relax bladder muscles.  Lifestyle changes. This may include: ? Weight loss. Talk to your health care provider about weight loss methods that would work best for you. ? Diet changes. This may include reducing how much alcohol and caffeine you consume, or drinking fluids at different times of the day. ? Not smoking. Do not use any products that contain nicotine or tobacco, such as cigarettes and e-cigarettes. If you need help quitting, ask your health care provider.  Surgery. ? A device may be implanted to help manage the nerve signals that control urination. ? An electrode may be implanted to stimulate electrical signals in the bladder. ? A procedure may be done to change the shape of  the bladder. This is done only in very severe cases. Follow these instructions at home: Lifestyle  Make any diet or lifestyle changes that are recommended by your health care provider. These may include: ? Drinking less fluid or drinking fluids at different times of the day. ? Cutting down on caffeine or alcohol. ? Doing Kegel exercises. ? Losing weight if needed. ? Eating a healthy and balanced diet to prevent constipation. This may include:  Eating foods that are high in fiber, such as fresh fruits and vegetables, whole grains, and beans.  Limiting foods that are high in fat and processed sugars, such as fried and sweet foods. General instructions  Take over-the-counter and prescription medicines only as told by your health care provider.  If you were prescribed an antibiotic medicine, take it as told by your health care provider. Do not stop taking the antibiotic even if you start to feel better.  Use any implants or pessary as told by your health care provider.  If needed, wear pads to absorb urine leakage.  Keep a journal or log to track how much and when you drink and when you feel the need to urinate. This will help your health care provider monitor your condition.  Keep all follow-up visits as told by your health care provider. This is important. Contact a health care provider if:  You have a fever.  Your symptoms do not get better with treatment.  Your pain and discomfort get worse.  You have more frequent urges to urinate. Get help right away if:  You are not able to control your bladder. Summary  Overactive bladder refers to a condition in which a person has a sudden need to pass urine.  Several conditions may lead to an overactive bladder.  Treatment for overactive bladder depends on the cause and severity of your condition.  Follow your health care provider's instructions about lifestyle changes, doing Kegel exercises, keeping a journal, and taking  medicines. This information is not intended to replace advice given to you by your health care provider. Make sure you discuss any questions you have with your health care provider. Document Revised: 02/08/2019 Document Reviewed: 11/03/2017 Elsevier Patient Education  2020 [employer].

## 2020-08-11 NOTE — Telephone Encounter (Signed)
scheduled

## 2020-08-11 NOTE — Progress Notes (Signed)
Established patient visit   Patient: Carly Stanley   DOB: [birth year]   49 y.o. Female  MRN: [id number] Visit Date: 08/11/2020  Today's healthcare provider: Margaretann Loveless, PA-C   Chief Complaint  Patient presents with  . Dysuria   Subjective    Dysuria  This is a recurrent problem. The current episode started more than 1 month ago (Reports that one day is ok then is back with the foul odor). The problem occurs every urination. The problem has been unchanged. The quality of the pain is described as burning and aching (off and on). There has been no fever. She is sexually active. There is no history of pyelonephritis. Associated symptoms include flank pain, frequency and urgency. Pertinent negatives include no chills, discharge or hematuria. Associated symptoms comments: Pressure on lower abdomen with some heaviness even after emptying her bladder with some incontinence. She has tried increased fluids (Cranberry juice) for the symptoms. The treatment provided no relief.    Patient Active Problem List   Diagnosis Date Noted  . Palpitations 11/29/2017  . Shortness of breath 11/29/2017  . PAC (premature atrial contraction) 11/29/2017  . OAB (overactive bladder) 09/28/2017  . Mixed incontinence 09/28/2017  . Pre-diabetes 2016   Past Medical History:  Diagnosis Date  . PAC (premature atrial contraction)   . Pre-diabetes 2016       Medications: Outpatient Medications Prior to Visit  Medication Sig  . Adalimumab (HUMIRA PEN [place]) 1 injection every 2 weeks  . folic acid (FOLVITE) 1 MG tablet Take 1 mg by mouth daily.   Marland Kitchen ibuprofen (ADVIL) 200 MG tablet Take 200 mg by mouth daily.   . methotrexate (RHEUMATREX) 2.5 MG tablet 4 tabs =10 mg weekly, 16 tabs for 4 weeks  . methotrexate (RHEUMATREX) 2.5 MG tablet Take 20 mg by mouth once a week. Caution:Chemotherapy. Protect from light.  . propranolol (INDERAL) 20 MG tablet TAKE ONE TABLET 3 TIMES DAILY AS NEEDED  . valACYclovir  (VALTREX) 1000 MG tablet Take 1 tablet (1,000 mg total) by mouth 3 (three) times daily.  . metoprolol succinate (TOPROL-XL) 25 MG 24 hr tablet Take 1 tablet (25 mg total) by mouth daily. (Patient not taking: Reported on 08/11/2020)  . [DISCONTINUED] cephALEXin (KEFLEX) 500 MG capsule Take 1 capsule (500 mg total) by mouth 2 (two) times daily.  . [DISCONTINUED] cyclobenzaprine (FLEXERIL) 5 MG tablet Take 1 tablet (5 mg total) by mouth 3 (three) times daily as needed for muscle spasms. (Patient not taking: Reported on 11/29/2019)   No facility-administered medications prior to visit.    Review of Systems  Constitutional: Negative for chills.  Respiratory: Negative.   Cardiovascular: Negative.   Genitourinary: Positive for dysuria, flank pain, frequency and urgency. Negative for hematuria.    Last CBC Lab Results  Component Value Date   WBC 7.5 10/11/2019   HGB 12.6 10/11/2019   HCT 37.7 10/11/2019   MCV 89 10/11/2019   MCH 29.6 10/11/2019   RDW 12.9 10/11/2019   PLT 336 10/11/2019   Last metabolic panel Lab Results  Component Value Date   GLUCOSE 97 10/11/2019   NA 139 10/11/2019   K 4.3 10/11/2019   CL 105 10/11/2019   CO2 18 (L) 10/11/2019   BUN 13 10/11/2019   CREATININE 0.82 10/11/2019   GFRNONAA 84 10/11/2019   GFRAA 96 10/11/2019   CALCIUM 9.4 10/11/2019   PROT 6.9 10/11/2019   ALBUMIN 4.1 10/11/2019   LABGLOB 2.8 10/11/2019   AGRATIO 1.5  10/11/2019   BILITOT 0.4 10/11/2019   ALKPHOS 72 10/11/2019   AST 17 10/11/2019   ALT 16 10/11/2019      Objective    BP 123/81 (BP Location: Left Arm, Patient Position: Sitting, Cuff Size: Large)   Pulse 76   Temp 98.2 F (36.8 C) (Oral)   Resp 16   Wt 199 lb 9.6 oz (90.5 kg)   BMI 36.51 kg/m  BP Readings from Last 3 Encounters:  08/11/20 123/81  04/23/20 100/80  11/29/19 107/76   Wt Readings from Last 3 Encounters:  08/11/20 199 lb 9.6 oz (90.5 kg)  04/23/20 188 lb 2 oz (85.3 kg)  11/29/19 197 lb 9.6 oz (89.6  kg)      Physical Exam Vitals reviewed.  Constitutional:      General: She is not in acute distress.    Appearance: Normal appearance. She is well-developed. She is obese. She is not ill-appearing.  HENT:     Head: Normocephalic and atraumatic.  Pulmonary:     Effort: Pulmonary effort is normal. No respiratory distress.  Musculoskeletal:     Cervical back: Normal range of motion and neck supple.  Neurological:     Mental Status: She is alert.  Psychiatric:        Behavior: Behavior normal.        Thought Content: Thought content normal.        Judgment: Judgment normal.       Results for orders placed or performed in visit on 08/11/20  POCT urinalysis dipstick  Result Value Ref Range   Color, UA light Yellow    Clarity, UA clear    Glucose, UA Negative Negative   Bilirubin, UA Negative    Ketones, UA Negative    Spec Grav, UA <=1.005 (A) 1.010 - 1.025   Blood, UA Negative    pH, UA 6.5 5.0 - 8.0   Protein, UA Negative Negative   Urobilinogen, UA 0.2 0.2 or 1.0 E.U./dL   Nitrite, UA Negative    Leukocytes, UA Negative Negative   Appearance     Odor      Assessment & Plan     1. Dysuria UA unremarkable. Possible BV as she has foul odor too. Will treat with metronidazole as below. Will also send for culture and f/u pending results. If symptoms are not improving with metronidazole it is possible it could be menopausal symptoms or OAB. May consider treatments for this pending results of other testing and response to medications.  - Urine Culture  2. BV (bacterial vaginosis) See above medical treatment plan. - metroNIDAZOLE (FLAGYL) 500 MG tablet; Take 1 tablet (500 mg total) by mouth 2 (two) times daily.  Dispense: 14 tablet; Refill: 0  3. OAB (overactive bladder) See above medical treatment plan. - Urine Culture  Return in about 2 months (around 10/11/2020) for CPE.      Delmer Islam, PA-C, have reviewed all documentation for this visit. The  documentation on 08/12/20 for the exam, diagnosis, procedures, and orders are all accurate and complete.   Reine Just  [facility] [phone] (phone) [phone] (fax)  [facility]

## 2020-08-11 NOTE — Telephone Encounter (Signed)
Can we call and schedule her for a visit for UTI please?  JB

## 2020-08-13 ENCOUNTER — Encounter: Payer: Self-pay | Admitting: Physician Assistant

## 2020-08-13 DIAGNOSIS — N3000 Acute cystitis without hematuria: Secondary | ICD-10-CM

## 2020-08-14 MED ORDER — CEPHALEXIN 500 MG PO CAPS: 500.0000 mg | ORAL_CAPSULE | Freq: Two times a day (BID) | ORAL | 0 refills | Status: DC

## 2020-08-15 ENCOUNTER — Telehealth: Payer: Self-pay

## 2020-08-15 DIAGNOSIS — R3989 Other symptoms and signs involving the genitourinary system: Secondary | ICD-10-CM

## 2020-08-15 LAB — URINE CULTURE

## 2020-08-15 MED ORDER — NITROFURANTOIN MONOHYD MACRO 100 MG PO CAPS: 100.0000 mg | ORAL_CAPSULE | Freq: Two times a day (BID) | ORAL | 0 refills | Status: DC

## 2020-08-15 NOTE — Telephone Encounter (Signed)
-----   Message from Margaretann Loveless, [place] sent at 08/15/2020  5:01 PM EDT ----- Urine culture actually grew out 2 different bacteria. One of the bacteria is resistant to the keflex. Will change treatment to Macrobid. Stop Keflex.

## 2020-08-15 NOTE — Addendum Note (Signed)
Addended by: Margaretann Loveless on: 08/15/2020 05:02 PM   Modules accepted: Orders

## 2020-08-15 NOTE — Telephone Encounter (Signed)
Seen by patient Carly Stanley on 08/15/2020 5:04 PM  Called [facility] many times no answer unable to leave message. Printed Prescription to fax today.

## 2020-08-20 ENCOUNTER — Telehealth: Payer: Self-pay

## 2020-08-20 NOTE — Telephone Encounter (Signed)
Copied from CRM [id number]. Topic: General - Other >> Aug 20, 2020  3:59 PM Elliot Gault wrote: Reason for CRM: Patient requesting pre visit labs for CPE and would like orders placed with [facility]

## 2020-08-21 NOTE — Telephone Encounter (Signed)
When is her physical? I see that it is not until 10/17/20. If that is the case I cant order labs til the week before to associate with the physical appt. Also our labs are ordered through [facility]. Does the [facility] use [facility]?

## 2020-08-21 NOTE — Telephone Encounter (Signed)
Pt advised.   Thanks,   -Arelis Neumeier  

## 2020-10-02 ENCOUNTER — Other Ambulatory Visit: Payer: Self-pay | Admitting: Physician Assistant

## 2020-10-02 DIAGNOSIS — Z1231 Encounter for screening mammogram for malignant neoplasm of breast: Secondary | ICD-10-CM

## 2020-10-15 NOTE — Progress Notes (Signed)
Complete physical exam   Patient: Carly Stanley   DOB: [birth year]   49 y.o. Female  MRN: [id number] Visit Date: 10/17/2020  Today's healthcare provider: Margaretann Loveless, PA-C   Chief Complaint  Patient presents with  . Annual Exam   Subjective    Carly Stanley is a 49 y.o. female who presents today for a complete physical exam.  She reports consuming a general diet. Home exercise routine includes walk about 30 minutes daily. She generally feels well. She reports sleeping fairly well. She does not have additional problems to discuss today.  HPI  12/09/019-Pap is normal, HPV negative. Will repeat in 3-5 years. 11/06/2020-Mammogram scheduled  Past Medical History:  Diagnosis Date  . PAC (premature atrial contraction)   . Pre-diabetes 2016   Past Surgical History:  Procedure Laterality Date  . CERVICAL DISC SURGERY  2016  . CHOLECYSTECTOMY    . OVARIAN CYST DRAINAGE     Social History   Socioeconomic History  . Marital status: Single    Spouse name: Not on file  . Number of children: 2  . Years of education: Not on file  . Highest education level: Some college, no degree  Occupational History    Employer: [employer]  Tobacco Use  . Smoking status: Never Smoker  . Smokeless tobacco: Never Used  Vaping Use  . Vaping Use: Never used  Substance and Sexual Activity  . Alcohol use: No  . Drug use: No  . Sexual activity: Yes  Other Topics Concern  . Not on file  Social History Narrative  . Not on file   Social Determinants of Health   Financial Resource Strain: Not on file  Food Insecurity: Not on file  Transportation Needs: Not on file  Physical Activity: Not on file  Stress: Not on file  Social Connections: Not on file  Intimate Partner Violence: Not on file   Family Status  Relation Name Status  . Mother  Alive  . Father  Alive  . Brother  (Not Specified)  . MGM  (Not Specified)  . PGF  (Not Specified)  . Son  (Not Specified)  . PGM  (Not  Specified)   Family History  Problem Relation Age of Onset  . Breast cancer Mother 40  . Hyperlipidemia Mother   . Hypertension Mother   . Anxiety disorder Mother   . Hypertension Father   . Diabetes Brother   . Cancer Maternal Grandmother        Sinus  . Lung cancer Paternal Grandfather 72  . Anxiety disorder Son   . ADD / ADHD Son   . Heart attack Paternal Grandmother   . Hypertension Paternal Grandmother   . Atrial fibrillation Paternal Grandmother    Allergies  Allergen Reactions  . Prednisone     Other reaction(s): Other (See Comments) Overwhelming feeling of anger, tears, etc.; did not like this reaction    Patient Care Team: Reine Just as PCP - General (Family Medicine)   Medications: Outpatient Medications Prior to Visit  Medication Sig  . Upadacitinib ER (RINVOQ) 15 MG TB24 Take by mouth.  . Adalimumab (HUMIRA PEN Gabbs) 1 injection every 2 weeks  . cephALEXin (KEFLEX) 500 MG capsule Take 1 capsule (500 mg total) by mouth 2 (two) times daily.  . folic acid (FOLVITE) 1 MG tablet Take 1 mg by mouth daily.   Marland Kitchen ibuprofen (ADVIL) 200 MG tablet Take 200 mg by mouth daily.   Marland Kitchen  methotrexate (RHEUMATREX) 2.5 MG tablet 4 tabs =10 mg weekly, 16 tabs for 4 weeks  . methotrexate (RHEUMATREX) 2.5 MG tablet Take 20 mg by mouth once a week. Caution:Chemotherapy. Protect from light.  . metoprolol succinate (TOPROL-XL) 25 MG 24 hr tablet Take 1 tablet (25 mg total) by mouth daily.  . nitrofurantoin, macrocrystal-monohydrate, (MACROBID) 100 MG capsule Take 1 capsule (100 mg total) by mouth 2 (two) times daily.  . propranolol (INDERAL) 20 MG tablet TAKE ONE TABLET 3 TIMES DAILY AS NEEDED  . valACYclovir (VALTREX) 1000 MG tablet Take 1 tablet (1,000 mg total) by mouth 3 (three) times daily.   No facility-administered medications prior to visit.    Review of Systems  Constitutional: Negative.   HENT: Negative.   Eyes: Negative.   Respiratory: Negative.    Cardiovascular: Negative.   Gastrointestinal: Negative.   Endocrine: Negative.   Genitourinary: Negative.   Musculoskeletal: Negative.   Skin: Negative.   Allergic/Immunologic: Negative.   Neurological: Negative.   Hematological: Negative.   Psychiatric/Behavioral: Negative.     Last CBC Lab Results  Component Value Date   WBC 6.1 10/17/2020   HGB 13.4 10/17/2020   HCT 40.0 10/17/2020   MCV 93 10/17/2020   MCH 31.2 10/17/2020   RDW 13.0 10/17/2020   PLT 277 10/17/2020   Last metabolic panel Lab Results  Component Value Date   GLUCOSE 93 10/17/2020   NA 139 10/17/2020   K 4.4 10/17/2020   CL 104 10/17/2020   CO2 22 10/17/2020   BUN 16 10/17/2020   CREATININE 0.97 10/17/2020   GFRNONAA 68 10/17/2020   GFRAA 78 10/17/2020   CALCIUM 9.7 10/17/2020   PROT 7.0 10/17/2020   ALBUMIN 4.6 10/17/2020   LABGLOB 2.4 10/17/2020   AGRATIO 1.9 10/17/2020   BILITOT 0.5 10/17/2020   ALKPHOS 44 10/17/2020   AST 15 10/17/2020   ALT 19 10/17/2020      Objective    BP 96/69 (BP Location: Left Arm, Patient Position: Sitting, Cuff Size: Large)   Pulse 72   Temp 98.1 F (36.7 C) (Oral)   Resp 16   Ht 5\' 2"  (1.575 m)   Wt 184 lb 12.8 oz (83.8 kg)   LMP 10/10/2020   BMI 33.80 kg/m  BP Readings from Last 3 Encounters:  10/17/20 96/69  08/11/20 123/81  04/23/20 100/80   Wt Readings from Last 3 Encounters:  10/17/20 184 lb 12.8 oz (83.8 kg)  08/11/20 199 lb 9.6 oz (90.5 kg)  04/23/20 188 lb 2 oz (85.3 kg)      Physical Exam Vitals reviewed.  Constitutional:      General: She is not in acute distress.    Appearance: Normal appearance. She is well-developed, well-groomed and well-nourished. She is obese. She is not ill-appearing or diaphoretic.  HENT:     Head: Normocephalic and atraumatic.     Right Ear: Tympanic membrane, ear canal and external ear normal.     Left Ear: Tympanic membrane, ear canal and external ear normal.     Nose: Nose normal.     Mouth/Throat:      Mouth: Oropharynx is clear and moist. Mucous membranes are moist.     Pharynx: Oropharynx is clear. No oropharyngeal exudate or posterior oropharyngeal erythema.  Eyes:     General: No scleral icterus.       Right eye: No discharge.        Left eye: No discharge.     Extraocular Movements: Extraocular movements intact and  EOM normal.     Conjunctiva/sclera: Conjunctivae normal.     Pupils: Pupils are equal, round, and reactive to light.  Neck:     Thyroid: No thyromegaly.     Vascular: No carotid bruit or JVD.     Trachea: No tracheal deviation.  Cardiovascular:     Rate and Rhythm: Normal rate and regular rhythm.     Pulses: Normal pulses and intact distal pulses.     Heart sounds: Normal heart sounds. No murmur heard. No friction rub. No gallop.   Pulmonary:     Effort: Pulmonary effort is normal. No respiratory distress.     Breath sounds: Normal breath sounds. No wheezing or rales.  Chest:     Chest wall: No tenderness.  Abdominal:     General: Abdomen is flat. Bowel sounds are normal. There is no distension.     Palpations: Abdomen is soft. There is no mass.     Tenderness: There is no abdominal tenderness. There is no guarding or rebound.  Musculoskeletal:        General: No tenderness or edema. Normal range of motion.     Cervical back: Normal range of motion and neck supple. No tenderness.     Right lower leg: No edema.     Left lower leg: No edema.  Lymphadenopathy:     Cervical: No cervical adenopathy.  Skin:    General: Skin is warm and dry.     Capillary Refill: Capillary refill takes less than 2 seconds.     Findings: No rash.  Neurological:     General: No focal deficit present.     Mental Status: She is alert and oriented to person, place, and time. Mental status is at baseline.  Psychiatric:        Mood and Affect: Mood and affect and mood normal.        Behavior: Behavior normal. Behavior is cooperative.        Thought Content: Thought content normal.         Judgment: Judgment normal.      Last depression screening scores PHQ 2/9 Scores 08/11/2020 10/12/2019 10/09/2018  PHQ - 2 Score 0 0 0  PHQ- 9 Score - - 0   Last fall risk screening Fall Risk  10/12/2019  Falls in the past year? 0  Number falls in past yr: 0  Injury with Fall? 0  Follow up Falls evaluation completed   Last Audit-C alcohol use screening Alcohol Use Disorder Test (AUDIT) 08/11/2020  1. How often do you have a drink containing alcohol? 0  2. How many drinks containing alcohol do you have on a typical day when you are drinking? 0  3. How often do you have six or more drinks on one occasion? 0  AUDIT-C Score 0  Alcohol Brief Interventions/Follow-up AUDIT Score <7 follow-up not indicated   A score of 3 or more in women, and 4 or more in men indicates increased risk for alcohol abuse, EXCEPT if all of the points are from question 1   No results found for any visits on 10/17/20.  Assessment & Plan    Routine Health Maintenance and Physical Exam  Exercise Activities and Dietary recommendations Goals   None     Immunization History  Administered Date(s) Administered  . Moderna Sars-Covid-2 Vaccination 10/30/2019, 11/30/2019  . Td 10/09/2018  . Tdap 07/11/2008    Health Maintenance  Topic Date Due  . Hepatitis C Screening  Never done  .  HIV Screening  Never done  . COLONOSCOPY  Never done  . COVID-19 Vaccine (3 - Booster for Moderna series) 05/29/2020  . INFLUENZA VACCINE  Never done  . PAP SMEAR-Modifier  10/09/2021  . MAMMOGRAM  11/04/2021  . TETANUS/TDAP  10/09/2028    Discussed health benefits of physical activity, and encouraged her to engage in regular exercise appropriate for her age and condition.  1. Annual physical exam Normal physical exam today. Will check labs as below and f/u pending lab results. If labs are stable and WNL she will not need to have these rechecked for one year at her next annual physical exam. She is to call the  office in the meantime if she has any acute issue, questions or concerns. - CBC with Differential/Platelet - Comprehensive metabolic panel - Hemoglobin A1c - Lipid panel - TSH  2. Encounter for breast cancer screening using non-mammogram modality There is family history of breast cancer. She does perform regular self breast exams. Mammogram was ordered as below. [facility] patient so she may schedule her mammogram at her convenience.  3. Class 1 obesity due to excess calories with serious comorbidity and body mass index (BMI) of 33.0 to 33.9 in adult Counseled patient on healthy lifestyle modifications including dieting and exercise. Will check labs as below and f/u pending results. - CBC with Differential/Platelet - Comprehensive metabolic panel - Hemoglobin A1c - Lipid panel - TSH  4. Pre-diabetes Diet controlled. Will check labs as below and f/u pending results. - CBC with Differential/Platelet - Comprehensive metabolic panel - Hemoglobin A1c - Lipid panel - TSH  5. Need for influenza vaccination Vaccine given to patient without complications. Patient sat for 15 minutes after administration and was tolerated well without adverse effects. - Flu Vaccine QUAD 6+ mos PF IM (Fluarix Quad PF)   No follow-ups on file.     Delmer IslamI, Ommie Degeorge M Nyanna Heideman, PA-C, have reviewed all documentation for this visit. The documentation on 11/03/20 for the exam, diagnosis, procedures, and orders are all accurate and complete.   Reine JustJennifer M Tiara Bartoli, PA-C  [facility] [phone] (phone) [phone] (fax)  [facility]

## 2020-10-17 ENCOUNTER — Ambulatory Visit (INDEPENDENT_AMBULATORY_CARE_PROVIDER_SITE_OTHER): Payer: Managed Care, Other (non HMO) | Admitting: Physician Assistant

## 2020-10-17 ENCOUNTER — Encounter: Payer: Self-pay | Admitting: Physician Assistant

## 2020-10-17 ENCOUNTER — Other Ambulatory Visit: Payer: Self-pay

## 2020-10-17 VITALS — BP 96/69 | HR 72 | Temp 98.1°F | Resp 16 | Ht 62.0 in | Wt 184.8 lb

## 2020-10-17 DIAGNOSIS — Z23 Encounter for immunization: Secondary | ICD-10-CM | POA: Diagnosis not present

## 2020-10-17 DIAGNOSIS — R7303 Prediabetes: Secondary | ICD-10-CM | POA: Diagnosis not present

## 2020-10-17 DIAGNOSIS — Z0000 Encounter for general adult medical examination without abnormal findings: Secondary | ICD-10-CM | POA: Diagnosis not present

## 2020-10-17 DIAGNOSIS — Z6833 Body mass index (BMI) 33.0-33.9, adult: Secondary | ICD-10-CM

## 2020-10-17 DIAGNOSIS — Z1239 Encounter for other screening for malignant neoplasm of breast: Secondary | ICD-10-CM | POA: Diagnosis not present

## 2020-10-17 DIAGNOSIS — E6609 Other obesity due to excess calories: Secondary | ICD-10-CM | POA: Diagnosis not present

## 2020-10-17 NOTE — Patient Instructions (Signed)

## 2020-10-18 LAB — COMPREHENSIVE METABOLIC PANEL
ALT: 19 IU/L (ref 0–32)
AST: 15 IU/L (ref 0–40)
Albumin/Globulin Ratio: 1.9 (ref 1.2–2.2)
Albumin: 4.6 g/dL (ref 3.8–4.9)
Alkaline Phosphatase: 44 IU/L (ref 44–121)
BUN/Creatinine Ratio: 16 (ref 9–23)
BUN: 16 mg/dL (ref 6–24)
Bilirubin Total: 0.5 mg/dL (ref 0.0–1.2)
CO2: 22 mmol/L (ref 20–29)
Calcium: 9.7 mg/dL (ref 8.7–10.2)
Chloride: 104 mmol/L (ref 96–106)
Creatinine, Ser: 0.97 mg/dL (ref 0.57–1.00)
GFR calc Af Amer: 78 mL/min/{1.73_m2} (ref >59)
GFR calc non Af Amer: 68 mL/min/{1.73_m2} (ref >59)
Globulin, Total: 2.4 g/dL (ref 1.5–4.5)
Glucose: 93 mg/dL (ref 65–99)
Potassium: 4.4 mmol/L (ref 3.5–5.2)
Sodium: 139 mmol/L (ref 134–144)
Total Protein: 7 g/dL (ref 6.0–8.5)

## 2020-10-18 LAB — CBC WITH DIFFERENTIAL/PLATELET
Basophils Absolute: 0.1 10*3/uL (ref 0.0–0.2)
Basos: 1 %
EOS (ABSOLUTE): 0.1 10*3/uL (ref 0.0–0.4)
Eos: 2 %
Hematocrit: 40 % (ref 34.0–46.6)
Hemoglobin: 13.4 g/dL (ref 11.1–15.9)
Immature Grans (Abs): 0 10*3/uL (ref 0.0–0.1)
Immature Granulocytes: 0 %
Lymphocytes Absolute: 2.3 10*3/uL (ref 0.7–3.1)
Lymphs: 38 %
MCH: 31.2 pg (ref 26.6–33.0)
MCHC: 33.5 g/dL (ref 31.5–35.7)
MCV: 93 fL (ref 79–97)
Monocytes Absolute: 0.7 10*3/uL (ref 0.1–0.9)
Monocytes: 11 %
Neutrophils Absolute: 3 10*3/uL (ref 1.4–7.0)
Neutrophils: 48 %
Platelets: 277 10*3/uL (ref 150–450)
RBC: 4.29 x10E6/uL (ref 3.77–5.28)
RDW: 13 % (ref 11.7–15.4)
WBC: 6.1 10*3/uL (ref 3.4–10.8)

## 2020-10-18 LAB — LIPID PANEL
Chol/HDL Ratio: 2.7 ratio (ref 0.0–4.4)
Cholesterol, Total: 160 mg/dL (ref 100–199)
HDL: 59 mg/dL (ref >39)
LDL Chol Calc (NIH): 93 mg/dL (ref 0–99)
Triglycerides: 36 mg/dL (ref 0–149)
VLDL Cholesterol Cal: 8 mg/dL (ref 5–40)

## 2020-10-18 LAB — HEMOGLOBIN A1C
Est. average glucose Bld gHb Est-mCnc: 105 mg/dL
Hgb A1c MFr Bld: 5.3 % (ref 4.8–5.6)

## 2020-10-18 LAB — TSH: TSH: 2.26 u[IU]/mL (ref 0.450–4.500)

## 2020-11-06 ENCOUNTER — Ambulatory Visit
Admission: RE | Admit: 2020-11-06 | Discharge: 2020-11-06 | Disposition: A | Discharge status: Home / Self Care | Payer: Managed Care, Other (non HMO) | Source: Ambulatory Visit | Attending: Physician Assistant | Admitting: Physician Assistant

## 2020-11-06 ENCOUNTER — Other Ambulatory Visit: Payer: Self-pay

## 2020-11-06 DIAGNOSIS — Z1231 Encounter for screening mammogram for malignant neoplasm of breast: Secondary | ICD-10-CM | POA: Diagnosis present

## 2020-11-14 ENCOUNTER — Encounter: Payer: Self-pay | Admitting: Physician Assistant

## 2020-11-24 ENCOUNTER — Encounter: Payer: Self-pay | Admitting: Physician Assistant

## 2020-11-24 MED ORDER — CYCLOBENZAPRINE HCL 5 MG PO TABS: 5.0000 mg | ORAL_TABLET | Freq: Three times a day (TID) | ORAL | 1 refills | Status: DC | PRN

## 2020-12-17 ENCOUNTER — Encounter: Payer: Self-pay | Admitting: Physician Assistant

## 2020-12-17 ENCOUNTER — Telehealth (INDEPENDENT_AMBULATORY_CARE_PROVIDER_SITE_OTHER): Payer: Managed Care, Other (non HMO) | Admitting: Physician Assistant

## 2020-12-17 DIAGNOSIS — J0140 Acute pansinusitis, unspecified: Secondary | ICD-10-CM

## 2020-12-17 MED ORDER — AMOXICILLIN-POT CLAVULANATE 875-125 MG PO TABS: 1.0000 | ORAL_TABLET | Freq: Two times a day (BID) | ORAL | 0 refills | Status: DC

## 2020-12-17 NOTE — Telephone Encounter (Signed)
Can we schedule her for a virtual appt 

## 2020-12-17 NOTE — Patient Instructions (Signed)
Sinusitis, Adult Sinusitis is inflammation of your sinuses. Sinuses are hollow spaces in the bones around your face. Your sinuses are located:  Around your eyes.  In the middle of your forehead.  Behind your nose.  In your cheekbones. Mucus normally drains out of your sinuses. When your nasal tissues become inflamed or swollen, mucus can become trapped or blocked. This allows bacteria, viruses, and fungi to grow, which leads to infection. Most infections of the sinuses are caused by a virus. Sinusitis can develop quickly. It can last for up to 4 weeks (acute) or for more than 12 weeks (chronic). Sinusitis often develops after a cold. What are the causes? This condition is caused by anything that creates swelling in the sinuses or stops mucus from draining. This includes:  Allergies.  Asthma.  Infection from bacteria or viruses.  Deformities or blockages in your nose or sinuses.  Abnormal growths in the nose (nasal polyps).  Pollutants, such as chemicals or irritants in the air.  Infection from fungi (rare). What increases the risk? You are more likely to develop this condition if you:  Have a weak body defense system (immune system).  Do a lot of swimming or diving.  Overuse nasal sprays.  Smoke. What are the signs or symptoms? The main symptoms of this condition are pain and a feeling of pressure around the affected sinuses. Other symptoms include:  Stuffy nose or congestion.  Thick drainage from your nose.  Swelling and warmth over the affected sinuses.  Headache.  Upper toothache.  A cough that may get worse at night.  Extra mucus that collects in the throat or the back of the nose (postnasal drip).  Decreased sense of smell and taste.  Fatigue.  A fever.  Sore throat.  Bad breath. How is this diagnosed? This condition is diagnosed based on:  Your symptoms.  Your medical history.  A physical exam.  Tests to find out if your condition is  acute or chronic. This may include: ? Checking your nose for nasal polyps. ? Viewing your sinuses using a device that has a light (endoscope). ? Testing for allergies or bacteria. ? Imaging tests, such as an MRI or CT scan. In rare cases, a bone biopsy may be done to rule out more serious types of fungal sinus disease. How is this treated? Treatment for sinusitis depends on the cause and whether your condition is chronic or acute.  If caused by a virus, your symptoms should go away on their own within 10 days. You may be given medicines to relieve symptoms. They include: ? Medicines that shrink swollen nasal passages (topical intranasal decongestants). ? Medicines that treat allergies (antihistamines). ? A spray that eases inflammation of the nostrils (topical intranasal corticosteroids). ? Rinses that help get rid of thick mucus in your nose (nasal saline washes).  If caused by bacteria, your health care provider may recommend waiting to see if your symptoms improve. Most bacterial infections will get better without antibiotic medicine. You may be given antibiotics if you have: ? A severe infection. ? A weak immune system.  If caused by narrow nasal passages or nasal polyps, you may need to have surgery. Follow these instructions at home: Medicines  Take, use, or apply over-the-counter and prescription medicines only as told by your health care provider. These may include nasal sprays.  If you were prescribed an antibiotic medicine, take it as told by your health care provider. Do not stop taking the antibiotic even if you start   to feel better. Hydrate and humidify  Drink enough fluid to keep your urine pale yellow. Staying hydrated will help to thin your mucus.  Use a cool mist humidifier to keep the humidity level in your home above 50%.  Inhale steam for 10-15 minutes, 3-4 times a day, or as told by your health care provider. You can do this in the bathroom while a hot shower is  running.  Limit your exposure to cool or dry air.   Rest  Rest as much as possible.  Sleep with your head raised (elevated).  Make sure you get enough sleep each night. General instructions  Apply a warm, moist washcloth to your face 3-4 times a day or as told by your health care provider. This will help with discomfort.  Wash your hands often with soap and water to reduce your exposure to germs. If soap and water are not available, use hand sanitizer.  Do not smoke. Avoid being around people who are smoking (secondhand smoke).  Keep all follow-up visits as told by your health care provider. This is important.   Contact a health care provider if:  You have a fever.  Your symptoms get worse.  Your symptoms do not improve within 10 days. Get help right away if:  You have a severe headache.  You have persistent vomiting.  You have severe pain or swelling around your face or eyes.  You have vision problems.  You develop confusion.  Your neck is stiff.  You have trouble breathing. Summary  Sinusitis is soreness and inflammation of your sinuses. Sinuses are hollow spaces in the bones around your face.  This condition is caused by nasal tissues that become inflamed or swollen. The swelling traps or blocks the flow of mucus. This allows bacteria, viruses, and fungi to grow, which leads to infection.  If you were prescribed an antibiotic medicine, take it as told by your health care provider. Do not stop taking the antibiotic even if you start to feel better.  Keep all follow-up visits as told by your health care provider. This is important. This information is not intended to replace advice given to you by your health care provider. Make sure you discuss any questions you have with your health care provider. Document Revised: 03/20/2018 Document Reviewed: 03/20/2018 Elsevier Patient Education  2021 Elsevier Inc.  

## 2020-12-17 NOTE — Progress Notes (Signed)
MyChart Video Visit    Virtual Visit via Video Note   This visit type was conducted due to national recommendations for restrictions regarding the COVID-19 Pandemic (e.g. social distancing) in an effort to limit this patient's exposure and mitigate transmission in our community. This patient is at least at moderate risk for complications without adequate follow up. This format is felt to be most appropriate for this patient at this time. Physical exam was limited by quality of the video and audio technology used for the visit.   Patient location: Home Provider location: [facility]  I discussed the limitations of evaluation and management by telemedicine and the availability of in person appointments. The patient expressed understanding and agreed to proceed.  Patient: Carly Stanley   DOB: Mar 21, 1971   50 y.o. Female  MRN: [id number] Visit Date: 12/17/2020  Today's healthcare provider: Margaretann Loveless, PA-C   No chief complaint on file.  Subjective    Sinusitis This is a new problem. The current episode started in the past 7 days (last Friday). The problem has been gradually worsening since onset. There has been no fever. The pain is mild. Associated symptoms include chills (this weekend), congestion, headaches, a hoarse voice, sinus pressure, sneezing and a sore throat. Pertinent negatives include no coughing, ear pain, shortness of breath or swollen glands. Past treatments include spray decongestants, saline sprays and acetaminophen (mucinex). The treatment provided mild relief.    Has taken 3 covid 19 test and all have been negative.  Patient Active Problem List   Diagnosis Date Noted  . Palpitations 11/29/2017  . Shortness of breath 11/29/2017  . PAC (premature atrial contraction) 11/29/2017  . OAB (overactive bladder) 09/28/2017  . Mixed incontinence 09/28/2017  . Pre-diabetes 2016   Past Medical History:  Diagnosis Date  . PAC (premature atrial  contraction)   . Pre-diabetes 2016      Medications: Outpatient Medications Prior to Visit  Medication Sig  . cyclobenzaprine (FLEXERIL) 5 MG tablet Take 1 tablet (5 mg total) by mouth 3 (three) times daily as needed for muscle spasms.  Marland Kitchen Upadacitinib ER (RINVOQ) 15 MG TB24 Take by mouth.   No facility-administered medications prior to visit.    Review of Systems  Constitutional: Positive for chills (this weekend) and fatigue. Negative for fever.  HENT: Positive for congestion, hoarse voice, postnasal drip, rhinorrhea, sinus pressure, sinus pain, sneezing and sore throat. Negative for ear pain, tinnitus and trouble swallowing.   Respiratory: Negative for cough, chest tightness and shortness of breath.   Cardiovascular: Negative for chest pain, palpitations and leg swelling.  Gastrointestinal: Negative for abdominal pain and nausea.  Musculoskeletal: Positive for myalgias (yesterday).  Neurological: Positive for headaches. Negative for dizziness.    Last CBC Lab Results  Component Value Date   WBC 6.1 10/17/2020   HGB 13.4 10/17/2020   HCT 40.0 10/17/2020   MCV 93 10/17/2020   MCH 31.2 10/17/2020   RDW 13.0 10/17/2020   PLT 277 10/17/2020   Last metabolic panel Lab Results  Component Value Date   GLUCOSE 93 10/17/2020   NA 139 10/17/2020   K 4.4 10/17/2020   CL 104 10/17/2020   CO2 22 10/17/2020   BUN 16 10/17/2020   CREATININE 0.97 10/17/2020   GFRNONAA 68 10/17/2020   GFRAA 78 10/17/2020   CALCIUM 9.7 10/17/2020   PROT 7.0 10/17/2020   ALBUMIN 4.6 10/17/2020   LABGLOB 2.4 10/17/2020   AGRATIO 1.9 10/17/2020   BILITOT 0.5 10/17/2020  ALKPHOS 44 10/17/2020   AST 15 10/17/2020   ALT 19 10/17/2020      Objective    There were no vitals taken for this visit. BP Readings from Last 3 Encounters:  10/17/20 96/69  08/11/20 123/81  04/23/20 100/80   Wt Readings from Last 3 Encounters:  10/17/20 184 lb 12.8 oz (83.8 kg)  08/11/20 199 lb 9.6 oz (90.5 kg)   04/23/20 188 lb 2 oz (85.3 kg)      Physical Exam Vitals reviewed.  Constitutional:      General: She is not in acute distress.    Appearance: Normal appearance. She is well-developed and well-nourished. She is not ill-appearing.  HENT:     Head: Normocephalic and atraumatic.     Comments: Reports sinus pressure in maxillary and frontal sinus areas, nasal congestion Eyes:     Extraocular Movements: EOM normal.  Pulmonary:     Effort: Pulmonary effort is normal. No respiratory distress.  Musculoskeletal:     Cervical back: Normal range of motion and neck supple.  Neurological:     Mental Status: She is alert.  Psychiatric:        Mood and Affect: Mood and affect normal.        Behavior: Behavior normal.        Thought Content: Thought content normal.        Judgment: Judgment normal.        Assessment & Plan     1. Acute non-recurrent pansinusitis Worsening symptoms that have not responded to OTC medications. Will give augmentin as below. Continue allergy medications. Stay well hydrated and get plenty of rest. Call if no symptom improvement or if symptoms worsen. - amoxicillin-clavulanate (AUGMENTIN) 875-125 MG tablet; Take 1 tablet by mouth 2 (two) times daily.  Dispense: 20 tablet; Refill: 0   No follow-ups on file.     I discussed the assessment and treatment plan with the patient. The patient was provided an opportunity to ask questions and all were answered. The patient agreed with the plan and demonstrated an understanding of the instructions.   The patient was advised to call back or seek an in-person evaluation if the symptoms worsen or if the condition fails to improve as anticipated.  I provided 15 minutes of face-to-face time during this encounter via MyChart Video enabled encounter.  Delmer Islam, PA-C, have reviewed all documentation for this visit. The documentation on 12/17/20 for the exam, diagnosis, procedures, and orders are all accurate and  complete.  Reine Just [facility] [phone] (phone) [phone] (fax)  [facility]

## 2021-02-09 ENCOUNTER — Other Ambulatory Visit: Payer: Self-pay | Admitting: Physician Assistant

## 2021-02-12 ENCOUNTER — Other Ambulatory Visit: Payer: Self-pay | Admitting: Cardiovascular Disease

## 2021-02-12 NOTE — Telephone Encounter (Signed)
*  STAT* If patient is at the pharmacy, call can be transferred to refill team.   1. Which medications need to be refilled? (please list name of each medication and dose if known) propanolol 20 MG 1 pill 3 a day as needed   2. Which pharmacy/location (including street and city if local pharmacy) is medication to be sent to? [facility]   3. Do they need a 30 day or 90 day supply? 90 day

## 2021-02-16 MED ORDER — PROPRANOLOL HCL 20 MG PO TABS: 20.0000 mg | ORAL_TABLET | Freq: Three times a day (TID) | ORAL | 0 refills | Status: DC | PRN

## 2021-04-29 ENCOUNTER — Other Ambulatory Visit: Payer: Self-pay | Admitting: Family Medicine

## 2021-04-29 ENCOUNTER — Other Ambulatory Visit: Payer: Self-pay | Admitting: Cardiovascular Disease

## 2021-06-02 ENCOUNTER — Other Ambulatory Visit: Payer: Self-pay | Admitting: Family Medicine

## 2021-06-02 NOTE — Telephone Encounter (Signed)
Requested medications are due for refill today.  yes  Requested medications are on the active medications list.  yes  Last refill. 04/29/2021  Future visit scheduled.   no  Notes to clinic.  PCP listed is Dentist. Pt was last seen via video visit on 12/17/2020. Pt had annual on 10/17/2020. No protocol for this medication. Please advise.

## 2021-06-04 ENCOUNTER — Telehealth: Payer: Self-pay | Admitting: Physician Assistant

## 2021-06-04 NOTE — Telephone Encounter (Signed)
*  STAT* If patient is at the pharmacy, call can be transferred to refill team.   1. Which medications need to be refilled? (please list name of each medication and dose if known)   Propanolol 20 mg po TID prn    2. Which pharmacy/location (including street and city if local pharmacy) is medication to be sent to? [facility]   3. Do they need a 30 day or 90 day supply? 90   Last ov note 04-2020 fu PRN

## 2021-06-04 NOTE — Progress Notes (Signed)
Cardiology Office Note  Date:  06/05/2021   ID:  Carly Stanley, DOB 1971-03-04, MRN [id number]  PCP:  Pcp, No   Chief Complaint  Patient presents with   Follow-up    Patient c/o palpitations at times on the propranolol. Medications reviewed by the patient verbally.     HPI:  Carly Stanley is a 50 year old woman with past medical history of Palpitations Prediabetes RA Who presents for evaluation of her abnormal heart rhythm, palpitations  LoV with myself 2019 At that time treated with Bystolic 5 mg and propranolol as needed for ectopy May have helped, insurance issues Tried metoprolol succinate 25 mg  Seen by one of our providers April 23, 2020  In follow-up today she reports having more palpitations, particularly past few months Some good and bad days Somemtimes SOB Currently taking propranolol TID for sx  Having issues with RA, just finished prednisone  Echo 05/2020 reviewed in detail  1. Left ventricular ejection fraction, by estimation, is 60 to 65%. The  left ventricle has normal function. The left ventricle has no regional  wall motion abnormalities. Left ventricular diastolic parameters were  normal.   2. Right ventricular systolic function is normal. The right ventricular  size is normal. There is normal pulmonary artery systolic pressure.   3. The mitral valve is normal in structure. No evidence of mitral valve  regurgitation. No evidence of mitral stenosis.   4. The aortic valve is normal in structure. Aortic valve regurgitation is  not visualized. No aortic stenosis is present.   5. The inferior vena cava is normal in size with greater than 50%  respiratory variability, suggesting right atrial pressure of 3 mmHg.   EKG personally reviewed by myself on todays visit Shows normal sinus rhythm with rate 87 bpm frequent PVCs   PMH:   has a past medical history of PAC (premature atrial contraction) and Pre-diabetes (2016).  PSH:    Past Surgical History:   Procedure Laterality Date   CERVICAL DISC SURGERY  2016   CHOLECYSTECTOMY     OVARIAN CYST DRAINAGE      Current Outpatient Medications  Medication Sig Dispense Refill   propranolol (INDERAL) 20 MG tablet TAKE ONE TABLET 3 TIMES DAILY AS NEEDED (OFFICE VISIT NEEDED FOR REFILLS) 90 tablet 0   Upadacitinib ER (RINVOQ) 15 MG TB24 Take by mouth.     amoxicillin-clavulanate (AUGMENTIN) 875-125 MG tablet Take 1 tablet by mouth 2 (two) times daily. (Patient not taking: Reported on 06/05/2021) 20 tablet 0   cyclobenzaprine (FLEXERIL) 5 MG tablet Take 1 tablet (5 mg total) by mouth 3 (three) times daily as needed for muscle spasms. (Patient not taking: Reported on 06/05/2021) 30 tablet 1   No current facility-administered medications for this visit.     Allergies:   Prednisone   Social History:  The patient  reports that she has never smoked. She has never used smokeless tobacco. She reports that she does not drink alcohol and does not use drugs.   Family History:   family history includes ADD / ADHD in her son; Anxiety disorder in her mother and son; Atrial fibrillation in her paternal grandmother; Breast cancer (age of onset: 35) in her mother; Cancer in her maternal grandmother; Diabetes in her brother; Heart attack in her paternal grandmother; Hyperlipidemia in her mother; Hypertension in her father, mother, and paternal grandmother; Lung cancer (age of onset: 20) in her paternal grandfather.    Review of Systems: Review of Systems  Constitutional: Negative.  Respiratory: Negative.    Cardiovascular:  Positive for palpitations.  Gastrointestinal: Negative.   Musculoskeletal: Negative.   Neurological: Negative.   Psychiatric/Behavioral: Negative.    All other systems reviewed and are negative.   PHYSICAL EXAM: VS:  BP 122/70 (BP Location: Left Arm, Patient Position: Sitting, Cuff Size: Normal)   Pulse 87   Ht 5\' 2"  (1.575 m)   Wt 184 lb 8 oz (83.7 kg)   SpO2 98%   BMI 33.75 kg/m   , BMI Body mass index is 33.75 kg/m. Constitutional:  oriented to person, place, and time. No distress.  HENT:  Head: Grossly normal Eyes:  no discharge. No scleral icterus.  Neck: No JVD, no carotid bruits  Cardiovascular: Regular rate and rhythm, no murmurs appreciated Pulmonary/Chest: Clear to auscultation bilaterally, no wheezes or rails Abdominal: Soft.  no distension.  no tenderness.  Musculoskeletal: Normal range of motion Neurological:  normal muscle tone. Coordination normal. No atrophy Skin: Skin warm and dry Psychiatric: normal affect, pleasant   Recent Labs: 10/17/2020: ALT 19; BUN 16; Creatinine, Ser 0.97; Hemoglobin 13.4; Platelets 277; Potassium 4.4; Sodium 139; TSH 2.260    Lipid Panel Lab Results  Component Value Date   CHOL 160 10/17/2020   HDL 59 10/17/2020   LDLCALC 93 10/17/2020   TRIG 36 10/17/2020      Wt Readings from Last 3 Encounters:  06/05/21 184 lb 8 oz (83.7 kg)  10/17/20 184 lb 12.8 oz (83.8 kg)  08/11/20 199 lb 9.6 oz (90.5 kg)       ASSESSMENT AND PLAN:  Palpitations/PVCs Longstanding history of palpitations Recommend retrial of bystolic 5-10 milligrams daily with extra 5 to 10 mg as needed Propranolol 10 to 20 mg up to 3 times daily as needed as needed Prior echocardiogram showing normal LV function We did discuss possibly doing a ZIO monitor to determine PVC burden Discussed antiarrhythmic medication options If needed, could have EP weigh in   Total encounter time more than 25 minutes  Greater than 50% was spent in counseling and coordination of care with the patient   Orders Placed This Encounter  Procedures   EKG 12-Lead    Signed, 10/11/20, M.D., Ph.D. 06/05/2021  [facility] [place], [place]

## 2021-06-05 ENCOUNTER — Ambulatory Visit: Payer: Managed Care, Other (non HMO) | Admitting: Cardiovascular Disease

## 2021-06-05 ENCOUNTER — Other Ambulatory Visit: Payer: Self-pay

## 2021-06-05 ENCOUNTER — Encounter: Payer: Self-pay | Admitting: Cardiovascular Disease

## 2021-06-05 VITALS — BP 122/70 | HR 87 | Ht 62.0 in | Wt 184.5 lb

## 2021-06-05 DIAGNOSIS — I491 Atrial premature depolarization: Secondary | ICD-10-CM

## 2021-06-05 DIAGNOSIS — R002 Palpitations: Secondary | ICD-10-CM

## 2021-06-05 MED ORDER — NEBIVOLOL HCL 10 MG PO TABS: 10.0000 mg | ORAL_TABLET | Freq: Every day | ORAL | 3 refills | Status: DC

## 2021-06-05 MED ORDER — PROPRANOLOL HCL 20 MG PO TABS: 20.0000 mg | ORAL_TABLET | Freq: Three times a day (TID) | ORAL | 6 refills | Status: DC | PRN

## 2021-06-05 MED ORDER — NEBIVOLOL HCL 10 MG PO TABS: 10.0000 mg | ORAL_TABLET | Freq: Two times a day (BID) | ORAL | 3 refills | Status: DC

## 2021-06-05 NOTE — Patient Instructions (Addendum)
Medication Instructions:   START Bystolic 10 mg daily  with extra 10 mg as needed  for breakthrough palpitations, PVCs  Continue propranolol 10 to 20 mg  as needed up to three times a day  If no improvement, call the office  We might need a ZIO monitor  If you need a refill on your cardiac medications before your next appointment, please call your pharmacy.   Lab work: No new labs needed  Testing/Procedures: No new testing needed   Follow-Up: At [facility], you and your health needs are our priority.  As part of our continuing mission to provide you with exceptional heart care, we have created designated Provider Care Teams.  These Care Teams include your primary Cardiologist (physician) and Advanced Practice Providers (APPs -  Physician Assistants and Nurse Practitioners) who all work together to provide you with the care you need, when you need it.  You will need a follow up appointment as needed  Providers on your designated Care Team:   Nicolasa Ducking, NP Eula Listen, PA-C Marisue Ivan, PA-C [place], [place]  COVID-19 Vaccine Information can be found at: [email or web address] For questions related to vaccine distribution or appointments, please email vaccine@ [email or web address] or call [phone].

## 2021-06-29 ENCOUNTER — Ambulatory Visit (INDEPENDENT_AMBULATORY_CARE_PROVIDER_SITE_OTHER): Payer: Managed Care, Other (non HMO)

## 2021-06-29 ENCOUNTER — Other Ambulatory Visit: Payer: Self-pay

## 2021-06-29 DIAGNOSIS — I493 Ventricular premature depolarization: Secondary | ICD-10-CM

## 2021-06-29 NOTE — Progress Notes (Signed)
Zio monitor order Dr. Mariah Milling sent secure message advising   I do not see a monitor in the computer but think we need a ZIO monitor  We need to quantify PVC burden  If heavy burden, may benefit from consultation with EP to determine if ablation might be in order or electrical medication  TG   Replied to pt through her MyChart regarding Zio

## 2021-07-01 DIAGNOSIS — I493 Ventricular premature depolarization: Secondary | ICD-10-CM

## 2021-08-10 ENCOUNTER — Telehealth: Payer: Self-pay

## 2021-08-10 MED ORDER — NEBIVOLOL HCL 10 MG PO TABS: ORAL_TABLET | ORAL | 3 refills | Status: DC

## 2021-08-10 NOTE — Telephone Encounter (Signed)
Was able to reach out to pt via phone and make contact to review their recent ZIO monitor results. Dr. Mariah Milling advised based on the current results   Zio monitor  2% of beats are PACs  3% of beats are PVCs  Not high burden, but appears symptoms triggered by PVCs  Would continue to use the beta-blocker bystolic 5 to 10 mg, extra beta-blocker on bad days  We will try to avoid antiarrhythmic medications like flecainide   Pt verbalized understanding, is thankful for the results call, all questions and concerns were address. Refill for Bystolic sent in to [facility] with extra pills for PRN need. Mrs. Duke wiill call back for anything further, f/u as schedule.

## 2021-12-06 ENCOUNTER — Encounter: Payer: Self-pay | Admitting: Emergency Medicine

## 2021-12-06 ENCOUNTER — Ambulatory Visit
Admission: EM | Admit: 2021-12-06 | Discharge: 2021-12-06 | Disposition: A | Discharge status: Home / Self Care | Payer: Managed Care, Other (non HMO)

## 2021-12-06 ENCOUNTER — Other Ambulatory Visit: Payer: Self-pay

## 2021-12-06 DIAGNOSIS — U071 COVID-19: Secondary | ICD-10-CM | POA: Diagnosis not present

## 2021-12-06 HISTORY — DX: Rheumatoid vasculitis with rheumatoid arthritis of unspecified site: M05.20

## 2021-12-06 MED ORDER — PROMETHAZINE-DM 6.25-15 MG/5ML PO SYRP
5.0000 mL | ORAL_SOLUTION | Freq: Four times a day (QID) | ORAL | 0 refills | Status: DC | PRN
Start: 2021-12-06 — End: 2022-01-01

## 2021-12-06 MED ORDER — MOLNUPIRAVIR 200 MG PO CAPS: 4.0000 | ORAL_CAPSULE | Freq: Two times a day (BID) | ORAL | 0 refills | Status: AC

## 2021-12-06 NOTE — ED Triage Notes (Signed)
Pt presents with fever, HA, ST, bodyaches and cough sx started yesterday. Her At home covid test this morning was positive.

## 2021-12-06 NOTE — Discharge Instructions (Signed)
If you develop any red flag symptoms such as chest pain or severe shortness of breath go immediately to the nearest emergency department.  Take all medication as prescribed.  You may return to work on Friday 12/11/20.

## 2021-12-06 NOTE — ED Provider Notes (Signed)
[facility]    CSN: [id number] Arrival date & time: 12/06/21  1020      History   Chief Complaint Chief Complaint  Patient presents with   Cough   Sore Throat   Generalized Body Aches   Headache   Fever    HPI Carly Stanley is a 51 y.o. female.   HPI Patient presents for evaluation of cough, sore throat, body aches, headache, and fever. Patient tested positive for COVID-19 today. She denies SOB or CP. Endorses severe generalized body aches as patient already suffers from RA and COVID is worsening body aches. She has taken ibuprofen with mild relief. Patient would benefit from antiviral therapy due to current diagnosis of PAC, RA, Prediabetes, and obesity. 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(HCC)     Patient Active Problem List   Diagnosis Date Noted   Palpitations 11/29/2017   Shortness of breath 11/29/2017   PAC (premature atrial contraction) 11/29/2017   OAB (overactive bladder) 09/28/2017   Mixed incontinence 09/28/2017   Pre-diabetes 2016    Past Surgical History:  Procedure Laterality Date   CERVICAL DISC SURGERY  2016   CHOLECYSTECTOMY     OVARIAN CYST DRAINAGE      OB History     Gravida  2   Para  2   Term  0   Preterm  2   AB      Living         SAB      IAB      Ectopic      Multiple      Live Births  2            Home Medications    Prior to Admission medications   Medication Sig Start Date End Date Taking? Authorizing Provider  molnupiravir EUA (LAGEVRIO) 200 MG CAPS capsule Take 4 capsules (800 mg total) by mouth 2 (two) times daily for 5 days. 12/06/21 12/11/21 Yes Bing Neighbors, FNP  promethazine-dextromethorphan (PROMETHAZINE-DM) 6.25-15 MG/5ML syrup Take 5 mLs by mouth 4 (four) times daily as needed for cough. 12/06/21  Yes Bing Neighbors, FNP  amoxicillin-clavulanate (AUGMENTIN) 875-125 MG tablet Take 1 tablet by  mouth 2 (two) times daily. Patient not taking: Reported on 06/05/2021 12/17/20   Margaretann Loveless, PA-C  cyclobenzaprine (FLEXERIL) 5 MG tablet Take 1 tablet (5 mg total) by mouth 3 (three) times daily as needed for muscle spasms. Patient not taking: Reported on 06/05/2021 11/24/20   Margaretann Loveless, PA-C  Diethylpropion HCl CR 75 MG TB24 Take 1 tablet by mouth daily. 10/27/21   [provider]  nebivolol (BYSTOLIC) 10 MG tablet Take 10 mg twice a day, may take extra 10 mg as needed for breakthrough palpitations 08/10/21   Antonieta Iba, MD  ORENCIA 125 MG/ML SOSY Inject 1 Syringe into the skin once a week. 11/18/21   [provider]  propranolol (INDERAL) 20 MG tablet Take 1 tablet (20 mg total) by mouth 3 (three) times daily as needed. 06/05/21   Antonieta Iba, MD  Upadacitinib ER (RINVOQ) 15 MG TB24 Take by mouth. 09/01/20   [provider]  Vitamin D, Ergocalciferol, (DRISDOL) 1.25 MG (50000 UNIT) CAPS capsule Take 50,000 Units by mouth once a week. 09/03/21   [provider]    Family History Family History  Problem Relation Age of Onset   Breast cancer  Mother 20   Hyperlipidemia Mother    Hypertension Mother    Anxiety disorder Mother    Hypertension Father    Diabetes Brother    Cancer Maternal Grandmother        Sinus   Lung cancer Paternal Grandfather 32   Anxiety disorder Son    ADD / ADHD Son    Heart attack Paternal Grandmother    Hypertension Paternal Grandmother    Atrial fibrillation Paternal Grandmother     Social History Social History   Tobacco Use   Smoking status: Never   Smokeless tobacco: Never  Vaping Use   Vaping Use: Never used  Substance Use Topics   Alcohol use: No   Drug use: No     Allergies   Prednisone   Review of Systems Review of Systems Pertinent negatives listed in HPI   Physical Exam Triage Vital Signs ED Triage Vitals  Enc Vitals Group     BP 12/06/21 1119 139/87     Pulse Rate  12/06/21 1119 67     Resp 12/06/21 1119 18     Temp 12/06/21 1119 98.6 F (37 C)     Temp Source 12/06/21 1119 Oral     SpO2 12/06/21 1119 96 %     Weight --      Height --      Head Circumference --      Peak Flow --      Pain Score 12/06/21 1123 8     Pain Loc --      Pain Edu? --      Excl. in GC? --    No data found.  Updated Vital Signs BP 139/87 (BP Location: Left Arm)    Pulse 67    Temp 98.6 F (37 C) (Oral)    Resp 18    LMP  (LMP Unknown)    SpO2 96%   Visual Acuity Right Eye Distance:   Left Eye Distance:   Bilateral Distance:    Right Eye Near:   Left Eye Near:    Bilateral Near:     Physical Exam Constitutional:      Appearance: She is ill-appearing.  HENT:     Head: Normocephalic and atraumatic.     Nose: Congestion present.  Cardiovascular:     Rate and Rhythm: Normal rate and regular rhythm.  Pulmonary:     Effort: Pulmonary effort is normal.     Breath sounds: Normal breath sounds.  Musculoskeletal:     Cervical back: Normal range of motion.  Skin:    General: Skin is warm and dry.  Neurological:     Mental Status: She is alert.     UC Treatments / Results  Labs (all labs ordered are listed, but only abnormal results are displayed) Labs Reviewed - No data to display  EKG   Radiology No results found.  Procedures Procedures (including critical care time)  Medications Ordered in UC Medications - No data to display  Initial Impression / Assessment and Plan / UC Course  I have reviewed the triage vital signs and the nursing notes.  Pertinent labs & imaging results that were available during my care of the patient were reviewed by me and considered in my medical decision making (see chart for details).    Covid -19 infection per home test. Start molniupavir x 5 days to reduce the duration and severity of COVID. Promethazine DM for cough. Quarantine in accordance to current CDC guideline Final Clinical Impressions(s) /  UC  Diagnoses   Final diagnoses:  COVID-19 virus infection     Discharge Instructions      If you develop any red flag symptoms such as chest pain or severe shortness of breath go immediately to the nearest emergency department.  Take all medication as prescribed.  You may return to work on Friday 12/11/20.     ED Prescriptions     Medication Sig Dispense Auth. Provider   molnupiravir EUA (LAGEVRIO) 200 MG CAPS capsule Take 4 capsules (800 mg total) by mouth 2 (two) times daily for 5 days. 40 capsule Bing Neighbors, FNP   promethazine-dextromethorphan (PROMETHAZINE-DM) 6.25-15 MG/5ML syrup Take 5 mLs by mouth 4 (four) times daily as needed for cough. 180 mL Bing Neighbors, FNP      PDMP not reviewed this encounter.   Bing Neighbors, FNP 12/06/21 [phone]

## 2021-12-10 ENCOUNTER — Telehealth: Payer: Self-pay | Admitting: Obstetrics and Gynecology

## 2021-12-10 NOTE — Telephone Encounter (Signed)
Pt has a new gyn apt Friday 2/10 pt tested positive for Covid on Sunday- pt denies fever or other symptoms just experiencing slight cough. Pt asking if she can still be seen in office on Friday?

## 2021-12-11 ENCOUNTER — Encounter: Payer: Managed Care, Other (non HMO) | Admitting: Obstetrics and Gynecology

## 2021-12-11 ENCOUNTER — Other Ambulatory Visit: Payer: Self-pay

## 2021-12-31 NOTE — Patient Instructions (Signed)
Breast Self-Awareness Breast self-awareness means being familiar with how your breasts look and feel. It involves checking your breasts regularly and reporting any changes to your health care provider. Practicing breast self-awareness is important. Sometimes changes may not be harmful (are benign), but sometimes a change in your breasts can be a sign of a serious medical problem. It is important to learn how to do this procedure correctly so that you can catch problems early, when treatment is more likely to be successful. All women should practice breast self-awareness, including women who have had breast implants. What you need: A mirror. A well-lit room. How to do a breast self-exam A breast self-exam is one way to learn what is normal for your breasts and whether your breasts are changing. To do a breast self-exam: Look for changes  Remove all the clothing above your waist. Stand in front of a mirror in a room with good lighting. Put your hands on your hips. Push your hands firmly downward. Compare your breasts in the mirror. Look for differences between them (asymmetry), such as: Differences in shape. Differences in size. Puckers, dips, and bumps in one breast and not the other. Look at each breast for changes in the skin, such as: Redness. Scaly areas. Look for changes in your nipples, such as: Discharge. Bleeding. Dimpling. Redness. A change in position. Feel for changes Carefully feel your breasts for lumps and changes. It is best to do this while lying on your back on the floor, and again while sitting or standing in the tub or shower with soapy water on your skin. Feel each breast in the following way: Place the arm on the side of the breast you are examining above your head. Feel your breast with the other hand. Start in the nipple area and make -inch (2 cm) overlapping circles to feel your breast. Use the pads of your three middle fingers to do this. Apply light pressure,  then medium pressure, then firm pressure. The light pressure will allow you to feel the tissue closest to the skin. The medium pressure will allow you to feel the tissue that is a little deeper. The firm pressure will allow you to feel the tissue close to the ribs. Continue the overlapping circles, moving downward over the breast until you feel your ribs below your breast. Move one finger-width toward the center of the body. Continue to use the -inch (2 cm) overlapping circles to feel your breast as you move slowly up toward your collarbone. Continue the up-and-down exam using all three pressures until you reach your armpit.  Write down what you find Writing down what you find can help you remember what to discuss with your health care provider. Write down: What is normal for each breast. Any changes that you find in each breast, including: The kind of changes you find. Any pain or tenderness. Size and location of any lumps. Where you are in your menstrual cycle, if you are still menstruating. General tips and recommendations Examine your breasts every month. If you are breastfeeding, the best time to examine your breasts is after a feeding or after using a breast pump. If you menstruate, the best time to examine your breasts is 5-7 days after your period. Breasts are generally lumpier during menstrual periods, and it may be more difficult to notice changes. With time and practice, you will become more familiar with the variations in your breasts and more comfortable with the exam. Contact a health care provider if you:  See a change in the shape or size of your breasts or nipples. °See a change in the skin of your breast or nipples, such as a reddened or scaly area. °Have unusual discharge from your nipples. °Find a lump or thick area that was not there before. °Have pain in your breasts. °Have any concerns related to your breast health. °Summary °Breast self-awareness includes looking for  physical changes in your breasts, as well as feeling for any changes within your breasts. °Breast self-awareness should be performed in front of a mirror in a well-lit room. °You should examine your breasts every month. If you menstruate, the best time to examine your breasts is 5-7 days after your menstrual period. °Let your health care provider know of any changes you notice in your breasts, including changes in size, changes on the skin, pain or tenderness, or unusual fluid from your nipples. °This information is not intended to replace advice given to you by your health care provider. Make sure you discuss any questions you have with your health care provider. °Document Revised: 06/06/2018 Document Reviewed: 06/06/2018 °Elsevier Patient Education © 2022 Elsevier Inc. °Preventive Care 40-64 Years Old, Female °Preventive care refers to lifestyle choices and visits with your health care provider that can promote health and wellness. Preventive care visits are also called wellness exams. °What can I expect for my preventive care visit? °Counseling °Your health care provider may ask you questions about your: °Medical history, including: °Past medical problems. °Family medical history. °Pregnancy history. °Current health, including: °Menstrual cycle. °Method of birth control. °Emotional well-being. °Home life and relationship well-being. °Sexual activity and sexual health. °Lifestyle, including: °Alcohol, nicotine or tobacco, and drug use. °Access to firearms. °Diet, exercise, and sleep habits. °Work and work environment. °Sunscreen use. °Safety issues such as seatbelt and bike helmet use. °Physical exam °Your health care provider will check your: °Height and weight. These may be used to calculate your BMI (body mass index). BMI is a measurement that tells if you are at a healthy weight. °Waist circumference. This measures the distance around your waistline. This measurement also tells if you are at a healthy weight  and may help predict your risk of certain diseases, such as type 2 diabetes and high blood pressure. °Heart rate and blood pressure. °Body temperature. °Skin for abnormal spots. °What immunizations do I need? °Vaccines are usually given at various ages, according to a schedule. Your health care provider will recommend vaccines for you based on your age, medical history, and lifestyle or other factors, such as travel or where you work. °What tests do I need? °Screening °Your health care provider may recommend screening tests for certain conditions. This may include: °Lipid and cholesterol levels. °Diabetes screening. This is done by checking your blood sugar (glucose) after you have not eaten for a while (fasting). °Pelvic exam and Pap test. °Hepatitis B test. °Hepatitis C test. °HIV (human immunodeficiency virus) test. °STI (sexually transmitted infection) testing, if you are at risk. °Lung cancer screening. °Colorectal cancer screening. °Mammogram. Talk with your health care provider about when you should start having regular mammograms. This may depend on whether you have a family history of breast cancer. °BRCA-related cancer screening. This may be done if you have a family history of breast, ovarian, tubal, or peritoneal cancers. °Bone density scan. This is done to screen for osteoporosis. °Talk with your health care provider about your test results, treatment options, and if necessary, the need for more tests. °Follow these instructions at home: °Eating   and drinking  Eat a diet that includes fresh fruits and vegetables, whole grains, lean protein, and low-fat dairy products. Take vitamin and mineral supplements as recommended by your health care provider. Do not drink alcohol if: Your health care provider tells you not to drink. You are pregnant, may be pregnant, or are planning to become pregnant. If you drink alcohol: Limit how much you have to 0-1 drink a day. Know how much alcohol is in your drink.  In the [place] one drink equals one 12 oz bottle of beer (355 mL), one 5 oz glass of wine (148 mL), or one 1 oz glass of hard liquor (44 mL). Lifestyle Brush your teeth every morning and night with fluoride toothpaste. Floss one time each day. Exercise for at least 30 minutes 5 or more days each week. Do not use any products that contain nicotine or tobacco. These products include cigarettes, chewing tobacco, and vaping devices, such as e-cigarettes. If you need help quitting, ask your health care provider. Do not use drugs. If you are sexually active, practice safe sex. Use a condom or other form of protection to prevent STIs. If you do not wish to become pregnant, use a form of birth control. If you plan to become pregnant, see your health care provider for a prepregnancy visit. Take aspirin only as told by your health care provider. Make sure that you understand how much to take and what form to take. Work with your health care provider to find out whether it is safe and beneficial for you to take aspirin daily. Find healthy ways to manage stress, such as: Meditation, yoga, or listening to music. Journaling. Talking to a trusted person. Spending time with friends and family. Minimize exposure to UV radiation to reduce your risk of skin cancer. Safety Always wear your seat belt while driving or riding in a vehicle. Do not drive: If you have been drinking alcohol. Do not ride with someone who has been drinking. When you are tired or distracted. While texting. If you have been using any mind-altering substances or drugs. Wear a helmet and other protective equipment during sports activities. If you have firearms in your house, make sure you follow all gun safety procedures. Seek help if you have been physically or sexually abused. What's next? Visit your health care provider once a year for an annual wellness visit. Ask your health care provider how often you should have your eyes and teeth  checked. Stay up to date on all vaccines. This information is not intended to replace advice given to you by your health care provider. Make sure you discuss any questions you have with your health care provider. Document Revised: 04/15/2021 Document Reviewed: 04/15/2021 Elsevier Patient Education  [place].

## 2021-12-31 NOTE — Progress Notes (Signed)
ANNUAL PREVENTATIVE CARE GYNECOLOGY  ENCOUNTER NOTE  Subjective:       Carly Stanley is a 51 y.o. [id number] female here for to establish care, and for a routine annual gynecologic exam. The patient is sexually active.  The patient wears seatbelts: yes. The patient participates in regular exercise: no. Has the patient ever been transfused or tattooed?: no. The patient reports that there is not domestic violence in her life.  Current complaints: 1.  None    Gynecologic History Patient's last menstrual period was 11/10/2021 (approximate). Contraception: none Last Pap: 10/09/2018. Results were: normal Last mammogram: [id number]. Results were: normal Last Colonoscopy: Scheduled for next week Last Dexa Scan: never had one   Obstetric History OB History  Gravida Para Term Preterm AB Living  2 2 0 2      SAB IAB Ectopic Multiple Live Births          2    # Outcome Date GA Lbr Len/2nd Weight Sex Delivery Anes PTL Lv  2 Preterm           1 Preterm             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(HCC)     Family History  Problem Relation Age of Onset   Breast cancer Mother 34   Hyperlipidemia Mother    Hypertension Mother    Anxiety disorder Mother    Hypertension Father    Diabetes Brother    Cancer Maternal Grandmother        Sinus   Lung cancer Paternal Grandfather 50   Anxiety disorder Son    ADD / ADHD Son    Heart attack Paternal Grandmother    Hypertension Paternal Grandmother    Atrial fibrillation Paternal Grandmother     Past Surgical History:  Procedure Laterality Date   CERVICAL DISC SURGERY  2016   CHOLECYSTECTOMY     OVARIAN CYST DRAINAGE      Social History   Socioeconomic History   Marital status: Single    Spouse name: Not on file   Number of children: 2   Years of education: Not on file   Highest education level: Some college, no degree  Occupational History     Employer: [employer]  Tobacco Use   Smoking status: Never   Smokeless tobacco: Never  Vaping Use   Vaping Use: Never used  Substance and Sexual Activity   Alcohol use: No   Drug use: No   Sexual activity: Yes  Other Topics Concern   Not on file  Social History Narrative   Not on file   Social Determinants of Health   Financial Resource Strain: Not on file  Food Insecurity: Not on file  Transportation Needs: Not on file  Physical Activity: Not on file  Stress: Not on file  Social Connections: Not on file  Intimate Partner Violence: Not on file    Current Outpatient Medications on File Prior to Visit  Medication Sig Dispense Refill   amoxicillin-clavulanate (AUGMENTIN) 875-125 MG tablet Take 1 tablet by mouth 2 (two) times daily. (Patient not taking: Reported on 06/05/2021) 20 tablet 0   cyclobenzaprine (FLEXERIL) 5 MG tablet Take 1 tablet (5 mg total) by mouth 3 (three) times daily as needed for muscle spasms. (Patient not taking: Reported on 06/05/2021) 30 tablet 1   Diethylpropion HCl CR 75 MG TB24 Take 1 tablet by  mouth daily.     nebivolol (BYSTOLIC) 10 MG tablet Take 10 mg twice a day, may take extra 10 mg as needed for breakthrough palpitations 200 tablet 3   ORENCIA 125 MG/ML SOSY Inject 1 Syringe into the skin once a week.     promethazine-dextromethorphan (PROMETHAZINE-DM) 6.25-15 MG/5ML syrup Take 5 mLs by mouth 4 (four) times daily as needed for cough. 180 mL 0   propranolol (INDERAL) 20 MG tablet Take 1 tablet (20 mg total) by mouth 3 (three) times daily as needed. 90 tablet 6   Upadacitinib ER (RINVOQ) 15 MG TB24 Take by mouth.     Vitamin D, Ergocalciferol, (DRISDOL) 1.25 MG (50000 UNIT) CAPS capsule Take 50,000 Units by mouth once a week.     No current facility-administered medications on file prior to visit.    Allergies  Allergen Reactions   Prednisone     Other reaction(s): Other (See Comments) Overwhelming feeling of anger, tears, etc.; did not like  this reaction      Review of Systems ROS Review of Systems - General ROS: negative for - chills, fatigue, fever, hot flashes, night sweats, weight gain or weight loss Psychological ROS: negative for - anxiety, decreased libido, depression, mood swings, physical abuse or sexual abuse Ophthalmic ROS: negative for - blurry vision, eye pain or loss of vision ENT ROS: negative for - headaches, hearing change, visual changes or vocal changes Allergy and Immunology ROS: negative for - hives, itchy/watery eyes or seasonal allergies Hematological and Lymphatic ROS: negative for - bleeding problems, bruising, swollen lymph nodes or weight loss Endocrine ROS: negative for - galactorrhea, hair pattern changes, hot flashes, malaise/lethargy, mood swings, palpitations, polydipsia/polyuria, skin changes, temperature intolerance or unexpected weight changes Breast ROS: negative for - new or changing breast lumps or nipple discharge Respiratory ROS: negative for - cough or shortness of breath Cardiovascular ROS: negative for - chest pain, irregular heartbeat, palpitations or shortness of breath Gastrointestinal ROS: no abdominal pain, change in bowel habits, or black or bloody stools Genito-Urinary ROS: no dysuria, or hematuria. + overactive bladder, incontinence for severeal years Musculoskeletal ROS: negative for - joint pain or joint stiffness Neurological ROS: negative for - bowel and bladder control changes Dermatological ROS: negative for rash and skin lesion changes   Objective:   Resp 16    Ht 5\' 2"  (1.575 m)    Wt 193 lb 4.8 oz (87.7 kg)    LMP 11/10/2021 (Approximate)    BMI 35.36 kg/m  CONSTITUTIONAL: Well-developed, well-nourished female in no acute distress.  PSYCHIATRIC: Normal mood and affect. Normal behavior. Normal judgment and thought content. NEUROLGIC: Alert and oriented to person, place, and time. Normal muscle tone coordination. No cranial nerve deficit noted. HENT:  Normocephalic,  atraumatic, External right and left ear normal. Oropharynx is clear and moist EYES: Conjunctivae and EOM are normal. Pupils are equal, round, and reactive to light. No scleral icterus.  NECK: Normal range of motion, supple, no masses.  Normal thyroid.  SKIN: Skin is warm and dry. No rash noted. Not diaphoretic. No erythema. No pallor. CARDIOVASCULAR: Normal heart rate noted, regular rhythm, no murmur. RESPIRATORY: Clear to auscultation bilaterally. Effort and breath sounds normal, no problems with respiration noted. BREASTS: Symmetric in size. No masses, skin changes, nipple drainage, or lymphadenopathy. ABDOMEN: Soft, normal bowel sounds, no distention noted.  No tenderness, rebound or guarding.  BLADDER: Normal PELVIC:  Bladder no bladder distension noted  Urethra: normal appearing urethra with no masses, tenderness or lesions  Vulva:  normal appearing vulva with no masses, tenderness or lesions  Vagina: normal appearing vagina with normal color and discharge, no lesions  Cervix: normal appearing cervix without discharge or lesions  Uterus: uterus is normal size, shape, consistency and nontender  Adnexa: normal adnexa in size, nontender and no masses  RV: External Exam NormaI, No Rectal Masses, and Normal Sphincter tone  MUSCULOSKELETAL: Normal range of motion. No tenderness.  No cyanosis, clubbing, or edema.  2+ distal pulses. LYMPHATIC: No Axillary, Supraclavicular, or Inguinal Adenopathy.   Labs: Lab Results  Component Value Date   WBC 6.1 10/17/2020   HGB 13.4 10/17/2020   HCT 40.0 10/17/2020   MCV 93 10/17/2020   PLT 277 10/17/2020    Lab Results  Component Value Date   CREATININE 0.97 10/17/2020   BUN 16 10/17/2020   NA 139 10/17/2020   K 4.4 10/17/2020   CL 104 10/17/2020   CO2 22 10/17/2020    Lab Results  Component Value Date   ALT 19 10/17/2020   AST 15 10/17/2020   ALKPHOS 44 10/17/2020   BILITOT 0.5 10/17/2020    Lab Results  Component Value Date    CHOL 160 10/17/2020   HDL 59 10/17/2020   LDLCALC 93 10/17/2020   TRIG 36 10/17/2020   CHOLHDL 2.7 10/17/2020    Lab Results  Component Value Date   TSH 2.260 10/17/2020    Lab Results  Component Value Date   HGBA1C 5.3 10/17/2020     Assessment:   1. Encounter for well woman exam with routine gynecological exam   2. Encounter to establish care with new doctor   3. Breast cancer screening by mammogram   4. Obesity (BMI 35.0-39.9 without comorbidity)   5. OAB (overactive bladder)   6. Mixed incontinence   7. Family history of breast cancer in mother     Plan:  Pap:  Up to date. Patient can continue with q 5 year screens, will be due in 1 year.  Mammogram: Ordered. Family hx of breast cancer in mom, onset age 49.  Colon screening:   scheduled for next week Labs:  None ordered. Has labs performed by PCP.  Brought results with her, will scan to chart.  Routine preventative health maintenance measures emphasized: Exercise/Diet/Weight control, Tobacco Warnings, Alcohol/Substance use risks, Stress Management, Peer Pressure Issues, and Safe Sex COVID Vaccination status: Has completed 2 dose series, eligible for booster.  OAB with incontinence, discussed treatment options. Will start [place].  To f/u in 3 months.  Return to Clinic - 1 Year for annual exam.    Hildred Laser, MD [facility]

## 2022-01-01 ENCOUNTER — Encounter: Payer: Self-pay | Admitting: Obstetrics and Gynecology

## 2022-01-01 ENCOUNTER — Ambulatory Visit: Payer: Managed Care, Other (non HMO) | Admitting: Obstetrics and Gynecology

## 2022-01-01 ENCOUNTER — Other Ambulatory Visit: Payer: Self-pay

## 2022-01-01 VITALS — BP 112/78 | HR 75 | Resp 16 | Ht 62.0 in | Wt 193.3 lb

## 2022-01-01 DIAGNOSIS — E669 Obesity, unspecified: Secondary | ICD-10-CM

## 2022-01-01 DIAGNOSIS — Z7689 Persons encountering health services in other specified circumstances: Secondary | ICD-10-CM

## 2022-01-01 DIAGNOSIS — N3281 Overactive bladder: Secondary | ICD-10-CM

## 2022-01-01 DIAGNOSIS — Z124 Encounter for screening for malignant neoplasm of cervix: Secondary | ICD-10-CM

## 2022-01-01 DIAGNOSIS — Z803 Family history of malignant neoplasm of breast: Secondary | ICD-10-CM

## 2022-01-01 DIAGNOSIS — Z1231 Encounter for screening mammogram for malignant neoplasm of breast: Secondary | ICD-10-CM

## 2022-01-01 DIAGNOSIS — Z01419 Encounter for gynecological examination (general) (routine) without abnormal findings: Secondary | ICD-10-CM | POA: Diagnosis not present

## 2022-01-01 DIAGNOSIS — N3946 Mixed incontinence: Secondary | ICD-10-CM

## 2022-01-01 MED ORDER — TROSPIUM CHLORIDE 20 MG PO TABS: 20.0000 mg | ORAL_TABLET | Freq: Two times a day (BID) | ORAL | 3 refills | Status: DC

## 2022-02-04 ENCOUNTER — Ambulatory Visit
Admission: RE | Admit: 2022-02-04 | Discharge: 2022-02-04 | Disposition: A | Discharge status: Home / Self Care | Payer: Managed Care, Other (non HMO) | Source: Ambulatory Visit | Attending: Obstetrics and Gynecology | Admitting: Obstetrics and Gynecology

## 2022-02-04 DIAGNOSIS — Z1231 Encounter for screening mammogram for malignant neoplasm of breast: Secondary | ICD-10-CM | POA: Insufficient documentation

## 2022-04-06 ENCOUNTER — Encounter: Payer: Managed Care, Other (non HMO) | Admitting: Obstetrics and Gynecology

## 2022-12-15 ENCOUNTER — Ambulatory Visit: Payer: Managed Care, Other (non HMO) | Admitting: Physician Assistant

## 2022-12-15 ENCOUNTER — Encounter: Payer: Self-pay | Admitting: Physician Assistant

## 2022-12-15 VITALS — BP 93/56 | HR 71 | Temp 97.8°F | Resp 16 | Ht 62.0 in | Wt 197.6 lb

## 2022-12-15 DIAGNOSIS — Z6836 Body mass index (BMI) 36.0-36.9, adult: Secondary | ICD-10-CM

## 2022-12-15 DIAGNOSIS — M0579 Rheumatoid arthritis with rheumatoid factor of multiple sites without organ or systems involvement: Secondary | ICD-10-CM

## 2022-12-15 DIAGNOSIS — Z1231 Encounter for screening mammogram for malignant neoplasm of breast: Secondary | ICD-10-CM

## 2022-12-15 DIAGNOSIS — Z1211 Encounter for screening for malignant neoplasm of colon: Secondary | ICD-10-CM

## 2022-12-15 DIAGNOSIS — I491 Atrial premature depolarization: Secondary | ICD-10-CM

## 2022-12-15 MED ORDER — DULOXETINE HCL 30 MG PO CPEP: 30.0000 mg | ORAL_CAPSULE | Freq: Every day | ORAL | 1 refills | Status: DC

## 2022-12-15 NOTE — Progress Notes (Unsigned)
   I,Sulibeya S Dimas,acting as a Education administrator for [employer], PA-C.,have documented all relevant documentation on the behalf of Mikey Kirschner, PA-C,as directed by  Mikey Kirschner, PA-C while in the presence of Mikey Kirschner, PA-C.     Established patient visit   Patient: Carly Stanley   DOB: 1971/03/28   52 y.o. Female  MRN: [id number] Visit Date: 12/15/2022  Today's healthcare provider: Mikey Kirschner, PA-C   No chief complaint on file.  Subjective    HPI  Patient here to go over health since not been over a year ago.  Patient reports she has RA and would like to go over treatment. F/B Dr. Posey Pronto.   Pap-will see Dr. Marcelline Mates next month.  Colonoscopy done last year. [facility]. Requested report. Dr. Twana First Shahid.  Ibuprofen last week and a half ago   Bystolic is daily  Propraolol prn  Rx cymbalta New referral for rheum  Medications: Outpatient Medications Prior to Visit  Medication Sig   hydroxychloroquine (PLAQUENIL) 200 MG tablet Take 1 tablet by mouth 2 (two) times daily.   nebivolol (BYSTOLIC) 10 MG tablet Take 10 mg twice a day, may take extra 10 mg as needed for breakthrough palpitations   predniSONE (DELTASONE) 5 MG tablet Take by mouth as needed.   propranolol (INDERAL) 20 MG tablet Take 1 tablet (20 mg total) by mouth 3 (three) times daily as needed.   trospium (SANCTURA) 20 MG tablet Take 1 tablet (20 mg total) by mouth 2 (two) times daily.   [DISCONTINUED] ORENCIA 125 MG/ML SOSY Inject 1 Syringe into the skin once a week.   ACTEMRA ACTPEN 162 MG/0.9ML SOAJ Inject into the skin once a week.   No facility-administered medications prior to visit.    Review of Systems  {Labs  Heme  Chem  Endocrine  Serology  Results Review (optional):[id number]}   Objective    BP (!) 93/56 (BP Location: Right Arm, Patient Position: Sitting, Cuff Size: Large)   Pulse 71   Temp 97.8 F (36.6 C) (Temporal)   Resp 16   Ht 5\' 2"  (1.575 m)   Wt 197 lb 9.6 oz (89.6 kg)   SpO2  98%   BMI 36.14 kg/m   Physical Exam  ***  No results found for any visits on 12/15/22.  Assessment & Plan     ***  No follow-ups on file.     I, Mikey Kirschner, PA-C have reviewed all documentation for this visit. The documentation on  12/15/22 for the exam, diagnosis, procedures, and orders are all accurate and complete.  Mikey Kirschner, PA-C [facility] [address] [place], [place], [address] Office: [phone] Fax: [place]

## 2022-12-16 ENCOUNTER — Encounter: Payer: Self-pay | Admitting: Physician Assistant

## 2022-12-16 DIAGNOSIS — M0579 Rheumatoid arthritis with rheumatoid factor of multiple sites without organ or systems involvement: Secondary | ICD-10-CM | POA: Insufficient documentation

## 2022-12-16 NOTE — Assessment & Plan Note (Signed)
Currently manages with plaquenil 200 mg and actemra injections. Prednisone still on her med list as she still has some 'in case of emergency' -- ie cannot get out of bed at all d/t pain.   Referred to [facility] [place] rheum clinic.  Discussed trial of cymbalta -- not for maintenance of disease of RA but help manage pain and mental health consequences of chronic daily pain. Start with cymbalta 30 mg and titrate up to 60 mg if needed F/u 6 weeks

## 2022-12-16 NOTE — Assessment & Plan Note (Signed)
To clarify -- pt states she takes bystolic once daily and the propranolol prn

## 2023-01-12 ENCOUNTER — Ambulatory Visit (INDEPENDENT_AMBULATORY_CARE_PROVIDER_SITE_OTHER): Payer: Managed Care, Other (non HMO) | Admitting: Obstetrics and Gynecology

## 2023-01-12 ENCOUNTER — Encounter: Payer: Self-pay | Admitting: Obstetrics and Gynecology

## 2023-01-12 ENCOUNTER — Other Ambulatory Visit (HOSPITAL_COMMUNITY)
Admission: RE | Admit: 2023-01-12 | Discharge: 2023-01-12 | Disposition: A | Discharge status: Home / Self Care | Payer: Managed Care, Other (non HMO) | Source: Ambulatory Visit | Attending: Obstetrics and Gynecology | Admitting: Obstetrics and Gynecology

## 2023-01-12 VITALS — BP 115/81 | HR 66 | Resp 16 | Ht 62.0 in | Wt 191.2 lb

## 2023-01-12 DIAGNOSIS — Z13220 Encounter for screening for lipoid disorders: Secondary | ICD-10-CM

## 2023-01-12 DIAGNOSIS — Z131 Encounter for screening for diabetes mellitus: Secondary | ICD-10-CM

## 2023-01-12 DIAGNOSIS — Z124 Encounter for screening for malignant neoplasm of cervix: Secondary | ICD-10-CM | POA: Insufficient documentation

## 2023-01-12 DIAGNOSIS — Z01419 Encounter for gynecological examination (general) (routine) without abnormal findings: Secondary | ICD-10-CM | POA: Diagnosis not present

## 2023-01-12 DIAGNOSIS — N951 Menopausal and female climacteric states: Secondary | ICD-10-CM

## 2023-01-12 DIAGNOSIS — E669 Obesity, unspecified: Secondary | ICD-10-CM

## 2023-01-12 DIAGNOSIS — Z803 Family history of malignant neoplasm of breast: Secondary | ICD-10-CM

## 2023-01-12 NOTE — Patient Instructions (Signed)
Preventive Care 40-54 Years Old, Female Preventive care refers to lifestyle choices and visits with your health care provider that can promote health and wellness. Preventive care visits are also called wellness exams. What can I expect for my preventive care visit? Counseling Your health care provider may ask you questions about your: Medical history, including: Past medical problems. Family medical history. Pregnancy history. Current health, including: Menstrual cycle. Method of birth control. Emotional well-being. Home life and relationship well-being. Sexual activity and sexual health. Lifestyle, including: Alcohol, nicotine or tobacco, and drug use. Access to firearms. Diet, exercise, and sleep habits. Work and work environment. Sunscreen use. Safety issues such as seatbelt and bike helmet use. Physical exam Your health care provider will check your: Height and weight. These may be used to calculate your BMI (body mass index). BMI is a measurement that tells if you are at a healthy weight. Waist circumference. This measures the distance around your waistline. This measurement also tells if you are at a healthy weight and may help predict your risk of certain diseases, such as type 2 diabetes and high blood pressure. Heart rate and blood pressure. Body temperature. Skin for abnormal spots. What immunizations do I need?  Vaccines are usually given at various ages, according to a schedule. Your health care provider will recommend vaccines for you based on your age, medical history, and lifestyle or other factors, such as travel or where you work. What tests do I need? Screening Your health care provider may recommend screening tests for certain conditions. This may include: Lipid and cholesterol levels. Diabetes screening. This is done by checking your blood sugar (glucose) after you have not eaten for a while (fasting). Pelvic exam and Pap test. Hepatitis B test. Hepatitis C  test. HIV (human immunodeficiency virus) test. STI (sexually transmitted infection) testing, if you are at risk. Lung cancer screening. Colorectal cancer screening. Mammogram. Talk with your health care provider about when you should start having regular mammograms. This may depend on whether you have a family history of breast cancer. BRCA-related cancer screening. This may be done if you have a family history of breast, ovarian, tubal, or peritoneal cancers. Bone density scan. This is done to screen for osteoporosis. Talk with your health care provider about your test results, treatment options, and if necessary, the need for more tests. Follow these instructions at home: Eating and drinking  Eat a diet that includes fresh fruits and vegetables, whole grains, lean protein, and low-fat dairy products. Take vitamin and mineral supplements as recommended by your health care provider. Do not drink alcohol if: Your health care provider tells you not to drink. You are pregnant, may be pregnant, or are planning to become pregnant. If you drink alcohol: Limit how much you have to 0-1 drink a day. Know how much alcohol is in your drink. In the [place] one drink equals one 12 oz bottle of beer (355 mL), one 5 oz glass of wine (148 mL), or one 1 oz glass of hard liquor (44 mL). Lifestyle Brush your teeth every morning and night with fluoride toothpaste. Floss one time each day. Exercise for at least 30 minutes 5 or more days each week. Do not use any products that contain nicotine or tobacco. These products include cigarettes, chewing tobacco, and vaping devices, such as e-cigarettes. If you need help quitting, ask your health care provider. Do not use drugs. If you are sexually active, practice safe sex. Use a condom or other form of protection to   prevent STIs. If you do not wish to become pregnant, use a form of birth control. If you plan to become pregnant, see your health care provider for a  prepregnancy visit. Take aspirin only as told by your health care provider. Make sure that you understand how much to take and what form to take. Work with your health care provider to find out whether it is safe and beneficial for you to take aspirin daily. Find healthy ways to manage stress, such as: Meditation, yoga, or listening to music. Journaling. Talking to a trusted person. Spending time with friends and family. Minimize exposure to UV radiation to reduce your risk of skin cancer. Safety Always wear your seat belt while driving or riding in a vehicle. Do not drive: If you have been drinking alcohol. Do not ride with someone who has been drinking. When you are tired or distracted. While texting. If you have been using any mind-altering substances or drugs. Wear a helmet and other protective equipment during sports activities. If you have firearms in your house, make sure you follow all gun safety procedures. Seek help if you have been physically or sexually abused. What's next? Visit your health care provider once a year for an annual wellness visit. Ask your health care provider how often you should have your eyes and teeth checked. Stay up to date on all vaccines. This information is not intended to replace advice given to you by your health care provider. Make sure you discuss any questions you have with your health care provider. Document Revised: 04/15/2021 Document Reviewed: 04/15/2021 Elsevier Patient Education  2023 Elsevier Inc[employer] Breast Self-Awareness Breast self-awareness is knowing how your breasts look and feel. You need to: Check your breasts on a regular basis. Tell your doctor about any changes. Become familiar with the look and feel of your breasts. This can help you catch a breast problem while it is still small and can be treated. You should do breast self-exams even if you have breast implants. What you need: A mirror. A well-lit room. A pillow or other  soft object. How to do a breast self-exam Follow these steps to do a breast self-exam: Look for changes  Take off all the clothes above your waist. Stand in front of a mirror in a room with good lighting. Put your hands down at your sides. Compare your breasts in the mirror. Look for any difference between them, such as: A difference in shape. A difference in size. Wrinkles, dips, and bumps in one breast and not the other. Look at each breast for changes in the skin, such as: Redness. Scaly areas. Skin that has gotten thicker. Dimpling. Open sores (ulcers). Look for changes in your nipples, such as: Fluid coming out of a nipple. Fluid around a nipple. Bleeding. Dimpling. Redness. A nipple that looks pushed in (retracted), or that has changed position. Feel for changes Lie on your back. Feel each breast. To do this: Pick a breast to feel. Place a pillow under the shoulder closest to that breast. Put the arm closest to that breast behind your head. Feel the nipple area of that breast using the hand of your other arm. Feel the area with the pads of your three middle fingers by making small circles with your fingers. Use light, medium, and firm pressure. Continue the overlapping circles, moving downward over the breast. Keep making circles with your fingers. Stop when you feel your ribs. Start making circles with your fingers again, this time going   upward until you reach your collarbone. Then, make circles outward across your breast and into your armpit area. Squeeze your nipple. Check for discharge and lumps. Repeat these steps to check your other breast. Sit or stand in the tub or shower. With soapy water on your skin, feel each breast the same way you did when you were lying down. Write down what you find Writing down what you find can help you remember what to tell your doctor. Write down: What is normal for each breast. Any changes you find in each breast. These  include: The kind of changes you find. A tender or painful breast. Any lump you find. Write down its size and where it is. When you last had your monthly period (menstrual cycle). General tips If you are breastfeeding, the best time to check your breasts is after you feed your baby or after you use a breast pump. If you get monthly bleeding, the best time to check your breasts is 5-7 days after your monthly cycle ends. With time, you will become comfortable with the self-exam. You will also start to know if there are changes in your breasts. Contact a doctor if: You see a change in the shape or size of your breasts or nipples. You see a change in the skin of your breast or nipples, such as red or scaly skin. You have fluid coming from your nipples that is not normal. You find a new lump or thick area. You have breast pain. You have any concerns about your breast health. Summary Breast self-awareness includes looking for changes in your breasts and feeling for changes within your breasts. You should do breast self-awareness in front of a mirror in a well-lit room. If you get monthly periods (menstrual cycles), the best time to check your breasts is 5-7 days after your period ends. Tell your doctor about any changes you see in your breasts. Changes include changes in size, changes on the skin, painful or tender breasts, or fluid from your nipples that is not normal. This information is not intended to replace advice given to you by your health care provider. Make sure you discuss any questions you have with your health care provider. Document Revised: 03/25/2022 Document Reviewed: 08/20/2021 Elsevier Patient Education  2023 Elsevier Inc.  

## 2023-01-12 NOTE — Progress Notes (Signed)
ANNUAL PREVENTATIVE CARE GYNECOLOGY  ENCOUNTER NOTE  Subjective:       Carly Stanley is a 52 y.o. [phone] female here for a routine annual gynecologic exam. The patient is sexually active. The patient is not taking hormone replacement therapy. Patient denies post-menopausal vaginal bleeding. The patient wears seatbelts: yes. The patient participates in regular exercise: yes. Has the patient ever been transfused or tattooed?: no. The patient reports that there is not domestic violence in her life.  Current complaints: 1.  She has no new concerns today.    Gynecologic History No LMP recorded. Patient is perimenopausal. Contraception: post menopausal status Last Pap: 10/09/2018. Results were: normal Last mammogram: 02/24/2022. Results were: normal Last Colonoscopy: Last year: 10 years Last Dexa Scan: Never done   Obstetric History OB History  Gravida Para Term Preterm AB Living  3 2 0 '2 1 2  '$ SAB IAB Ectopic Multiple Live Births  1       2    # Outcome Date GA Lbr Len/2nd Weight Sex Delivery Anes PTL Lv  3 SAB           2 Preterm           1 Preterm             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([place])     Family History  Problem Relation Age of Onset   Breast cancer Mother 51   Hyperlipidemia Mother    Hypertension Mother    Anxiety disorder Mother    Hypertension Father    Diabetes Brother    Cancer Maternal Grandmother        Sinus   Lung cancer Paternal Grandfather 50   Anxiety disorder Son    ADD / ADHD Son    Heart attack Paternal Grandmother    Hypertension Paternal Grandmother    Atrial fibrillation Paternal Grandmother     Past Surgical History:  Procedure Laterality Date   CERVICAL DISC SURGERY  2016   CHOLECYSTECTOMY     OVARIAN CYST DRAINAGE      Social History   Socioeconomic History   Marital status: Single    Spouse name: Not on file   Number of children:  2   Years of education: Not on file   Highest education level: Some college, no degree  Occupational History    Employer:  [employer]  Tobacco Use   Smoking status: Never   Smokeless tobacco: Never  Vaping Use   Vaping Use: Never used  Substance and Sexual Activity   Alcohol use: No   Drug use: No   Sexual activity: Yes  Other Topics Concern   Not on file  Social History Narrative   Not on file   Social Determinants of Health   Financial Resource Strain: Not on file  Food Insecurity: Not on file  Transportation Needs: Not on file  Physical Activity: Inactive (09/28/2017)   Exercise Vital Sign    Days of Exercise per Week: 0 days    Minutes of Exercise per Session: 0 min  Stress: No Stress Concern Present (09/28/2017)   Wright of Stress : Not at all  Social Connections: Somewhat Isolated (09/28/2017)   Social Connection and Isolation Panel [NHANES]    Frequency of Communication with Friends and Family: More than three times a week  Frequency of Social Gatherings with Friends and Family: More than three times a week    Attends Religious Services: More than 4 times per year    Active Member of [employer] or Organizations: No    Attends Archivist Meetings: Never    Marital Status: Divorced  Human resources officer Violence: Not At Risk (09/28/2017)   Humiliation, Afraid, Rape, and Kick questionnaire    Fear of Current or Ex-Partner: No    Emotionally Abused: No    Physically Abused: No    Sexually Abused: No    Current Outpatient Medications on File Prior to Visit  Medication Sig Dispense Refill   ACTEMRA ACTPEN 162 MG/0.9ML SOAJ Inject into the skin once a week.     cetirizine (ZYRTEC) 10 MG tablet Take 10 mg by mouth daily.     DULoxetine (CYMBALTA) 30 MG capsule Take 1 capsule (30 mg total) by mouth daily. 90 capsule 1   hydroxychloroquine (PLAQUENIL) 200 MG tablet Take 1 tablet by  mouth 2 (two) times daily.     nebivolol (BYSTOLIC) 10 MG tablet Take 10 mg twice a day, may take extra 10 mg as needed for breakthrough palpitations 200 tablet 3   nebivolol (BYSTOLIC) 10 MG tablet Take 10 mg by mouth daily. Patient reported     predniSONE (DELTASONE) 5 MG tablet Take by mouth as needed.     propranolol (INDERAL) 20 MG tablet Take 1 tablet (20 mg total) by mouth 3 (three) times daily as needed. 90 tablet 6   propranolol (INDERAL) 20 MG tablet Take 20 mg by mouth daily as needed (palpitations). Patient reported     trospium (SANCTURA) 20 MG tablet Take 1 tablet (20 mg total) by mouth 2 (two) times daily. 60 tablet 3   No current facility-administered medications on file prior to visit.    Allergies  Allergen Reactions   Prednisone     Other reaction(s): Other (See Comments) Overwhelming feeling of anger, tears, etc.; did not like this reaction      Review of Systems ROS Review of Systems - General ROS: negative for - chills, fatigue, fever, hot flashes, night sweats, weight gain or weight loss Psychological ROS: negative for - anxiety, decreased libido, depression, mood swings, physical abuse or sexual abuse Ophthalmic ROS: negative for - blurry vision, eye pain or loss of vision ENT ROS: negative for - headaches, hearing change, visual changes or vocal changes Allergy and Immunology ROS: negative for - hives, itchy/watery eyes or seasonal allergies Hematological and Lymphatic ROS: negative for - bleeding problems, bruising, swollen lymph nodes or weight loss Endocrine ROS: negative for - galactorrhea, hair pattern changes, hot flashes, malaise/lethargy, mood swings, palpitations, polydipsia/polyuria, skin changes, temperature intolerance or unexpected weight changes Breast ROS: negative for - new or changing breast lumps or nipple discharge Respiratory ROS: negative for - cough or shortness of breath Cardiovascular ROS: negative for - chest pain, irregular heartbeat,  palpitations or shortness of breath Gastrointestinal ROS: no abdominal pain, change in bowel habits, or black or bloody stools Genito-Urinary ROS: no dysuria, trouble voiding, or hematuria Musculoskeletal ROS: negative for - joint pain or joint stiffness Neurological ROS: negative for - bowel and bladder control changes Dermatological ROS: negative for rash and skin lesion changes   Objective:   BP 115/81   Pulse 66   Resp 16   Ht '5\' 2"'$  (1.575 m)   Wt 191 lb 3.2 oz (86.7 kg)   LMP 11/10/2021 (Approximate)   BMI 34.97 kg/m  CONSTITUTIONAL:  Well-developed, well-nourished female in no acute distress.  PSYCHIATRIC: Normal mood and affect. Normal behavior. Normal judgment and thought content. [place]: Alert and oriented to person, place, and time. Normal muscle tone coordination. No cranial nerve deficit noted. HENT:  Normocephalic, atraumatic, External right and left ear normal. Oropharynx is clear and moist EYES: Conjunctivae and EOM are normal. Pupils are equal, round, and reactive to light. No scleral icterus.  NECK: Normal range of motion, supple, no masses.  Normal thyroid.  SKIN: Skin is warm and dry. No rash noted. Not diaphoretic. No erythema. No pallor. CARDIOVASCULAR: Normal heart rate noted, regular rhythm, no murmur. RESPIRATORY: Clear to auscultation bilaterally. Effort and breath sounds normal, no problems with respiration noted. BREASTS: Symmetric in size. No masses, skin changes, nipple drainage, or lymphadenopathy. ABDOMEN: Soft, normal bowel sounds, no distention noted.  No tenderness, rebound or guarding.  BLADDER: Normal PELVIC:  Bladder no bladder distension noted  Urethra: normal appearing urethra with no masses, tenderness or lesions  Vulva: normal appearing vulva with no masses, tenderness or lesions  Vagina: atrophic (mildly atrophic)  Cervix: normal appearing cervix without discharge or lesions  Uterus: uterus is normal size, shape, consistency and  nontender  Adnexa: normal adnexa in size, nontender and no masses  RV: External Exam NormaI, No Rectal Masses, and Normal Sphincter tone  MUSCULOSKELETAL: Normal range of motion. No tenderness.  No cyanosis, clubbing, or edema.  2+ distal pulses. LYMPHATIC: No Axillary, Supraclavicular, or Inguinal Adenopathy.   Labs: Lab Results  Component Value Date   WBC 6.1 10/17/2020   HGB 13.4 10/17/2020   HCT 40.0 10/17/2020   MCV 93 10/17/2020   PLT 277 10/17/2020    Lab Results  Component Value Date   CREATININE 0.97 10/17/2020   BUN 16 10/17/2020   NA 139 10/17/2020   K 4.4 10/17/2020   CL 104 10/17/2020   CO2 22 10/17/2020    Lab Results  Component Value Date   ALT 19 10/17/2020   AST 15 10/17/2020   ALKPHOS 44 10/17/2020   BILITOT 0.5 10/17/2020    Lab Results  Component Value Date   CHOL 160 10/17/2020   HDL 59 10/17/2020   LDLCALC 93 10/17/2020   TRIG 36 10/17/2020   CHOLHDL 2.7 10/17/2020    Lab Results  Component Value Date   TSH 2.260 10/17/2020    Lab Results  Component Value Date   HGBA1C 5.3 10/17/2020     Assessment:   1. Encounter for well woman exam with routine gynecological exam   2. Cervical cancer screening   3. Screening cholesterol level   4. Screening for diabetes mellitus (DM)   5. Obesity (BMI 35.0-39.9 without comorbidity)   6. Family history of breast cancer in mother   23. Menopausal state      Plan:  Pap: Pap Co Test Mammogram: Ordered Colon Screening:   UTD Labs:  See orders Routine preventative health maintenance measures emphasized:  Self Breast Exam and Exercise/Diet/Weight control Family hx of breast cancer in mom, onset age 81.  Can continue mammograms until age 45.  Menopausal, asymptomatic.  Return to [place], [place]

## 2023-01-13 LAB — COMPREHENSIVE METABOLIC PANEL
ALT: 25 IU/L (ref 0–32)
AST: 20 IU/L (ref 0–40)
Albumin/Globulin Ratio: 2.2 (ref 1.2–2.2)
Albumin: 4.9 g/dL (ref 3.8–4.9)
Alkaline Phosphatase: 40 IU/L — ABNORMAL LOW (ref 44–121)
BUN/Creatinine Ratio: 9 (ref 9–23)
BUN: 9 mg/dL (ref 6–24)
Bilirubin Total: 0.7 mg/dL (ref 0.0–1.2)
CO2: 24 mmol/L (ref 20–29)
Calcium: 9.7 mg/dL (ref 8.7–10.2)
Chloride: 103 mmol/L (ref 96–106)
Creatinine, Ser: 0.99 mg/dL (ref 0.57–1.00)
Globulin, Total: 2.2 g/dL (ref 1.5–4.5)
Glucose: 89 mg/dL (ref 70–99)
Potassium: 4.5 mmol/L (ref 3.5–5.2)
Sodium: 142 mmol/L (ref 134–144)
Total Protein: 7.1 g/dL (ref 6.0–8.5)
eGFR: 68 mL/min/{1.73_m2} (ref >59)

## 2023-01-13 LAB — HEMOGLOBIN A1C
Est. average glucose Bld gHb Est-mCnc: 117 mg/dL
Hgb A1c MFr Bld: 5.7 % — ABNORMAL HIGH (ref 4.8–5.6)

## 2023-01-13 LAB — LIPID PANEL
Chol/HDL Ratio: 2.4 ratio (ref 0.0–4.4)
Cholesterol, Total: 173 mg/dL (ref 100–199)
HDL: 71 mg/dL (ref >39)
LDL Chol Calc (NIH): 92 mg/dL (ref 0–99)
Triglycerides: 50 mg/dL (ref 0–149)
VLDL Cholesterol Cal: 10 mg/dL (ref 5–40)

## 2023-01-13 LAB — VITAMIN D 25 HYDROXY (VIT D DEFICIENCY, FRACTURES): Vit D, 25-Hydroxy: 21 ng/mL — ABNORMAL LOW (ref 30.0–100.0)

## 2023-01-13 LAB — CBC
Hematocrit: 39.7 % (ref 34.0–46.6)
Hemoglobin: 13.5 g/dL (ref 11.1–15.9)
MCH: 30.5 pg (ref 26.6–33.0)
MCHC: 34 g/dL (ref 31.5–35.7)
MCV: 90 fL (ref 79–97)
Platelets: 231 10*3/uL (ref 150–450)
RBC: 4.43 x10E6/uL (ref 3.77–5.28)
RDW: 12.1 % (ref 11.7–15.4)
WBC: 4.5 10*3/uL (ref 3.4–10.8)

## 2023-01-13 LAB — TSH: TSH: 1.95 u[IU]/mL (ref 0.450–4.500)

## 2023-01-14 ENCOUNTER — Ambulatory Visit: Payer: Managed Care, Other (non HMO) | Admitting: Physician Assistant

## 2023-01-17 ENCOUNTER — Encounter: Payer: Self-pay | Admitting: Obstetrics and Gynecology

## 2023-01-17 ENCOUNTER — Other Ambulatory Visit: Payer: Self-pay | Admitting: Obstetrics and Gynecology

## 2023-01-17 MED ORDER — VITAMIN D (ERGOCALCIFEROL) 1.25 MG (50000 UNIT) PO CAPS: 50000.0000 [IU] | ORAL_CAPSULE | ORAL | 0 refills | Status: DC

## 2023-01-19 LAB — CYTOLOGY - PAP
Comment: NEGATIVE
Diagnosis: NEGATIVE
High risk HPV: NEGATIVE

## 2023-01-22 ENCOUNTER — Ambulatory Visit
Admission: EM | Admit: 2023-01-22 | Discharge: 2023-01-22 | Disposition: A | Discharge status: Home / Self Care | Payer: Managed Care, Other (non HMO) | Attending: Emergency Medicine | Admitting: Emergency Medicine

## 2023-01-22 DIAGNOSIS — H1033 Unspecified acute conjunctivitis, bilateral: Secondary | ICD-10-CM | POA: Diagnosis not present

## 2023-01-22 DIAGNOSIS — J0100 Acute maxillary sinusitis, unspecified: Secondary | ICD-10-CM

## 2023-01-22 MED ORDER — AMOXICILLIN 875 MG PO TABS: 875.0000 mg | ORAL_TABLET | Freq: Two times a day (BID) | ORAL | 0 refills | Status: AC

## 2023-01-22 MED ORDER — POLYMYXIN B-TRIMETHOPRIM 10000-0.1 UNIT/ML-% OP SOLN: 1.0000 [drp] | Freq: Four times a day (QID) | OPHTHALMIC | 0 refills | Status: AC

## 2023-01-22 NOTE — Discharge Instructions (Addendum)
Use the antibiotic eyedrops as prescribed.  Take the amoxicillin as directed.   Follow-up with your primary care provider if your symptoms are not improving.

## 2023-01-22 NOTE — ED Provider Notes (Signed)
Roderic Palau    CSN: [id number] Arrival date & time: 01/22/23  1311      History   Chief Complaint Chief Complaint  Patient presents with   Nasal Congestion   Eye Problem    HPI Carly Stanley is a 52 y.o. female.  Patient presents with 1 week history of congestion, runny nose, postnasal drip which became worse yesterday.  She also reports bilateral eye redness and yellow drainage x 1 day.  She denies eye injury, change in vision, eye pain, fever, chills, ear pain, sore throat, cough, shortness of breath, or other symptoms.  Treating with warm compresses.     The history is provided by the patient and medical records.    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[facility])     Patient Active Problem List   Diagnosis Date Noted   Rheumatoid arthritis involving multiple sites with positive rheumatoid factor ([place]) 12/16/2022   Palpitations 11/29/2017   Shortness of breath 11/29/2017   PAC (premature atrial contraction) 11/29/2017   OAB (overactive bladder) 09/28/2017   Mixed incontinence 09/28/2017   Pre-diabetes 2016    Past Surgical History:  Procedure Laterality Date   CERVICAL DISC SURGERY  2016   CHOLECYSTECTOMY     OVARIAN CYST DRAINAGE      OB History     Gravida  3   Para  2   Term  0   Preterm  2   AB  1   Living  2      SAB  1   IAB      Ectopic      Multiple      Live Births  2            Home Medications    Prior to Admission medications   Medication Sig Start Date End Date Taking? Authorizing Provider  amoxicillin (AMOXIL) 875 MG tablet Take 1 tablet (875 mg total) by mouth 2 (two) times daily for 10 days. 01/22/23 02/01/23 Yes Sharion Balloon, NP  trimethoprim-polymyxin b (POLYTRIM) ophthalmic solution Place 1 drop into both eyes 4 (four) times daily for 7 days. 01/22/23 01/29/23 Yes Sharion Balloon, NP  Vitamin D, Ergocalciferol, (DRISDOL) 1.25 MG  (50000 UNIT) CAPS capsule Take 1 capsule (50,000 Units total) by mouth every 7 (seven) days. 01/17/23   Rubie Maid, MD  ACTEMRA ACTPEN 162 MG/0.9ML SOAJ Inject into the skin once a week.    [provider]  cetirizine (ZYRTEC) 10 MG tablet Take 10 mg by mouth daily.    [provider]  DULoxetine (CYMBALTA) 30 MG capsule Take 1 capsule (30 mg total) by mouth daily. 12/15/22   Mikey Kirschner, PA-C  hydroxychloroquine (PLAQUENIL) 200 MG tablet Take 1 tablet by mouth 2 (two) times daily. 10/28/22   [provider]  nebivolol (BYSTOLIC) 10 MG tablet Take 10 mg twice a day, may take extra 10 mg as needed for breakthrough palpitations 08/10/21   Minna Merritts, MD  predniSONE (DELTASONE) 5 MG tablet Take by mouth as needed. 12/22/21   [provider]  propranolol (INDERAL) 20 MG tablet Take 1 tablet (20 mg total) by mouth 3 (three) times daily as needed. 06/05/21   Minna Merritts, MD  Tofacitinib Citrate 5 MG TABS Take 1 tablet by mouth 2 (two) times daily. 12/24/22   [provider]    Family History Family History  Problem  Relation Age of Onset   Breast cancer Mother 66   Hyperlipidemia Mother    Hypertension Mother    Anxiety disorder Mother    Hypertension Father    Diabetes Brother    Cancer Maternal Grandmother        Sinus   Lung cancer Paternal Grandfather 26   Anxiety disorder Son    ADD / ADHD Son    Heart attack Paternal Grandmother    Hypertension Paternal Grandmother    Atrial fibrillation Paternal Grandmother     Social History Social History   Tobacco Use   Smoking status: Never   Smokeless tobacco: Never  Vaping Use   Vaping Use: Never used  Substance Use Topics   Alcohol use: No   Drug use: No     Allergies   Prednisone   Review of Systems Review of Systems  Constitutional:  Negative for chills and fever.  HENT:  Positive for congestion, postnasal drip, rhinorrhea and sinus pressure. Negative for ear pain  and sore throat.   Eyes:  Positive for discharge, redness and itching. Negative for pain and visual disturbance.  Respiratory:  Negative for cough and shortness of breath.   Cardiovascular:  Negative for chest pain and palpitations.  All other systems reviewed and are negative.    Physical Exam Triage Vital Signs ED Triage Vitals  Enc Vitals Group     BP 01/22/23 1339 105/75     Pulse Rate 01/22/23 1332 82     Resp 01/22/23 1332 18     Temp 01/22/23 1332 98.1 F (36.7 C)     Temp src --      SpO2 01/22/23 1332 96 %     Weight 01/22/23 1348 185 lb (83.9 kg)     Height 01/22/23 1348 5\' 2"  (1.575 m)     Head Circumference --      Peak Flow --      Pain Score 01/22/23 1347 2     Pain Loc --      Pain Edu? --      Excl. in [place]? --    No data found.  Updated Vital Signs BP 105/75   Pulse 82   Temp 98.1 F (36.7 C)   Resp 18   Ht 5\' 2"  (1.575 m)   Wt 185 lb (83.9 kg)   LMP 11/10/2021 (Approximate)   SpO2 96%   BMI 33.84 kg/m   Visual Acuity Right Eye Distance:   Left Eye Distance:   Bilateral Distance:    Right Eye Near:   Left Eye Near:    Bilateral Near:     Physical Exam Vitals and nursing note reviewed.  Constitutional:      General: She is not in acute distress.    Appearance: Normal appearance. She is well-developed. She is not ill-appearing.  HENT:     Right Ear: Tympanic membrane normal.     Left Ear: Tympanic membrane normal.     Nose: Congestion and rhinorrhea present.     Mouth/Throat:     Mouth: Mucous membranes are moist.  Eyes:     Extraocular Movements: Extraocular movements intact.     Pupils: Pupils are equal, round, and reactive to light.     Comments: Bilateral conjunctiva injected with yellow mucous drainage.   Cardiovascular:     Rate and Rhythm: Normal rate and regular rhythm.     Heart sounds: Normal heart sounds.  Pulmonary:     Effort: Pulmonary effort is normal. No  respiratory distress.     Breath sounds: Normal breath sounds.   Musculoskeletal:     Cervical back: Neck supple.  Skin:    General: Skin is warm and dry.  Neurological:     Mental Status: She is alert.  Psychiatric:        Mood and Affect: Mood normal.        Behavior: Behavior normal.      UC Treatments / Results  Labs (all labs ordered are listed, but only abnormal results are displayed) Labs Reviewed - No data to display  EKG   Radiology No results found.  Procedures Procedures (including critical care time)  Medications Ordered in UC Medications - No data to display  Initial Impression / Assessment and Plan / UC Course  I have reviewed the triage vital signs and the nursing notes.  Pertinent labs & imaging results that were available during my care of the patient were reviewed by me and considered in my medical decision making (see chart for details).    Acute sinusitis, bilateral bacterial conjunctivitis.  Afebrile, VSS.  Treating with 10 day course of amoxicillin.  Also treating with Polytrim eyedrops.  Education provided on sinusitis and bacterial conjunctivitis.  Instructed patient to follow-up with her PCP if symptoms are not improving.  She agrees to plan of care.   Final Clinical Impressions(s) / UC Diagnoses   Final diagnoses:  Acute non-recurrent maxillary sinusitis  Acute bacterial conjunctivitis of both eyes     Discharge Instructions      Use the antibiotic eyedrops as prescribed.  Take the amoxicillin as directed.   Follow-up with your primary care provider if your symptoms are not improving.        ED Prescriptions     Medication Sig Dispense Auth. Provider   amoxicillin (AMOXIL) 875 MG tablet Take 1 tablet (875 mg total) by mouth 2 (two) times daily for 10 days. 20 tablet Sharion Balloon, NP   trimethoprim-polymyxin b (POLYTRIM) ophthalmic solution Place 1 drop into both eyes 4 (four) times daily for 7 days. 10 mL Sharion Balloon, NP      PDMP not reviewed this encounter.   Sharion Balloon,  NP 01/22/23 [phone]

## 2023-01-22 NOTE — ED Triage Notes (Signed)
Patient to Urgent Care with complaints of nasal congestion, post nasal drip, eye drainage and burning.   Symptoms started last Friday. No hx of seasonal allergies. Denies any known fevers.  Reports last night she started having bilateral eye watering. States her right eye started swelling shut. Both watery/ yellow discharge. Using hot and cold compresses.

## 2023-01-28 ENCOUNTER — Telehealth: Payer: Self-pay | Admitting: Cardiovascular Disease

## 2023-01-28 ENCOUNTER — Other Ambulatory Visit: Payer: Self-pay | Admitting: Cardiovascular Disease

## 2023-01-28 NOTE — Telephone Encounter (Signed)
Needs appoint for refills. Last seen 2022. Patient may get refill through PCP if does not need to see cardiologist.

## 2023-01-28 NOTE — Telephone Encounter (Signed)
*  STAT* If patient is at the pharmacy, call can be transferred to refill team.   1. Which medications need to be refilled? (please list name of each medication and dose if known) nebivolol (BYSTOLIC) 10 MG tablet   Take 10 mg twice a day, may take extra 10 mg as needed for breakthrough palpitations    2. Which pharmacy/location (including street and city if local pharmacy) is medication to be sent to?[facility] [address] - [place], [place] [address]   3. Do they need a 30 day or 90 day supply? 90 day supply

## 2023-01-31 MED ORDER — NEBIVOLOL HCL 10 MG PO TABS: ORAL_TABLET | ORAL | 0 refills | Status: DC

## 2023-01-31 NOTE — Telephone Encounter (Signed)
Requested Prescriptions   Signed Prescriptions Disp Refills   nebivolol (BYSTOLIC) 10 MG tablet 60 tablet 0    Sig: Take 10 mg twice a day, may take extra 10 mg as needed for breakthrough palpitations    Authorizing Provider: Minna Merritts    Ordering User: Vanice Sarah

## 2023-01-31 NOTE — Telephone Encounter (Signed)
Pt scheduled on 4/18 

## 2023-02-11 ENCOUNTER — Ambulatory Visit: Payer: Managed Care, Other (non HMO) | Admitting: Physician Assistant

## 2023-02-11 ENCOUNTER — Encounter: Payer: Self-pay | Admitting: Physician Assistant

## 2023-02-11 VITALS — BP 115/78 | HR 63 | Temp 98.1°F | Resp 16 | Wt 184.4 lb

## 2023-02-11 DIAGNOSIS — R002 Palpitations: Secondary | ICD-10-CM | POA: Diagnosis not present

## 2023-02-11 DIAGNOSIS — J029 Acute pharyngitis, unspecified: Secondary | ICD-10-CM | POA: Diagnosis not present

## 2023-02-11 LAB — POCT RAPID STREP A (OFFICE): Rapid Strep A Screen: NEGATIVE

## 2023-02-11 MED ORDER — NEBIVOLOL HCL 10 MG PO TABS: ORAL_TABLET | ORAL | 1 refills | Status: DC

## 2023-02-11 NOTE — Progress Notes (Signed)
I,Carly Stanley,acting as a scribe for [employer], PA-C.,have documented all relevant documentation on the behalf of Carly Ferguson, PA-C,as directed by  Carly Ferguson, PA-C while in the presence of Carly Ferguson, PA-C.   Established patient visit   Patient: Carly Stanley   DOB: 10-Jun-1971   52 y.o. Female  MRN: [id number] Visit Date: 02/11/2023  Today's healthcare provider: [name], PA-C   Chief Complaint  Patient presents with   Sore Throat   Subjective    Sore Throat  This is a recurrent problem. Episode onset: 5 days ago. The problem has been unchanged. There has been no fever. Pain severity now: last night it was more painful than today. Associated symptoms include trouble swallowing. Pertinent negatives include no congestion, coughing, ear discharge, ear pain, headaches, shortness of breath or swollen glands. She has had no exposure to strep or mono. Treatments tried: chloraseptic spray and cough drops. Warm teas with honey. The treatment provided no relief.    Reports that 3 weeks ago she started with sinus infection and went ot he walking clinic and got treated with antibiotic. Patient finished antibiotic approximately 7 days ago.  Medications: Outpatient Medications Prior to Visit  Medication Sig   ACTEMRA ACTPEN 162 MG/0.9ML SOAJ Inject into the skin once a week.   cetirizine (ZYRTEC) 10 MG tablet Take 10 mg by mouth daily.   DULoxetine (CYMBALTA) 30 MG capsule Take 1 capsule (30 mg total) by mouth daily.   hydroxychloroquine (PLAQUENIL) 200 MG tablet Take 1 tablet by mouth 2 (two) times daily.   propranolol (INDERAL) 20 MG tablet Take 1 tablet (20 mg total) by mouth 3 (three) times daily as needed.   Tofacitinib Citrate 5 MG TABS Take 1 tablet by mouth 2 (two) times daily.   Vitamin D, Ergocalciferol, (DRISDOL) 1.25 MG (50000 UNIT) CAPS capsule Take 1 capsule (50,000 Units total) by mouth every 7 (seven) days.   [DISCONTINUED] nebivolol (BYSTOLIC) 10 MG  tablet Take 10 mg twice a day, may take extra 10 mg as needed for breakthrough palpitations   predniSONE (DELTASONE) 5 MG tablet Take by mouth as needed. (Patient not taking: Reported on 02/11/2023)   No facility-administered medications prior to visit.    Review of Systems  HENT:  Positive for trouble swallowing. Negative for congestion, ear discharge and ear pain.   Respiratory:  Negative for cough and shortness of breath.   Neurological:  Negative for headaches.        Objective    BP 115/78 (BP Location: Right Arm, Patient Position: Sitting, Cuff Size: Normal)   Pulse 63   Temp 98.1 F (36.7 C) (Oral)   Resp 16   Wt 184 lb 6.4 oz (83.6 kg)   LMP 11/10/2021 (Approximate)   SpO2 99%   BMI 33.73 kg/m    Physical Exam Vitals reviewed.  Constitutional:      Appearance: She is not ill-appearing.  HENT:     Head: Normocephalic.     Mouth/Throat:     Mouth: Mucous membranes are moist.     Pharynx: Posterior oropharyngeal erythema present. No oropharyngeal exudate.     Tonsils: No tonsillar exudate or tonsillar abscesses.  Eyes:     Conjunctiva/sclera: Conjunctivae normal.  Cardiovascular:     Rate and Rhythm: Normal rate.  Pulmonary:     Effort: Pulmonary effort is normal. No respiratory distress.  Lymphadenopathy:     Cervical: No cervical adenopathy.  Neurological:     General: No focal deficit  present.     Mental Status: She is alert and oriented to person, place, and time.  Psychiatric:        Mood and Affect: Mood normal.        Behavior: Behavior normal.      Results for orders placed or performed in visit on 02/11/23  POCT rapid strep A  Result Value Ref Range   Rapid Strep A Screen Negative Negative    Assessment & Plan     1. Acute pharyngitis, unspecified etiology Strep neg Advised increasing zyrtec to bid for the next week Continuing otc therapy -- hot teas, honey, throat spray, gargling salt water.  She has a prednisone rx she uses prn for RA  -- advised if pain is not tolerable can do 20 mg daily x 5 days   2. Sore throat Strep neg - POCT rapid strep A  3. Palpitations Refilling for pt. She takes bystolic 10 mg daily and the propranolol prn for breakthru palps. She did not want me to change the sig on the rx today as it comes from dr Mariah Milling   - nebivolol (BYSTOLIC) 10 MG tablet; Take 10 mg twice a day, may take extra 10 mg as needed for breakthrough palpitations  Dispense: 90 tablet; Refill: 1  Return if symptoms worsen or fail to improve.      I, Carly Ferguson, PA-C have reviewed all documentation for this visit. The documentation on  02/11/23  for the exam, diagnosis, procedures, and orders are all accurate and complete.  Carly Ferguson, PA-C [facility] [address] [place], [place], [address] Office: [phone] Fax: [phone]   [facility]

## 2023-02-17 ENCOUNTER — Ambulatory Visit: Payer: Managed Care, Other (non HMO) | Admitting: Medical

## 2023-02-21 ENCOUNTER — Ambulatory Visit
Admission: RE | Admit: 2023-02-21 | Discharge: 2023-02-21 | Disposition: A | Discharge status: Home / Self Care | Payer: Managed Care, Other (non HMO) | Source: Ambulatory Visit | Attending: Physician Assistant | Admitting: Physician Assistant

## 2023-02-21 DIAGNOSIS — Z1231 Encounter for screening mammogram for malignant neoplasm of breast: Secondary | ICD-10-CM

## 2023-04-22 ENCOUNTER — Other Ambulatory Visit: Payer: Self-pay | Admitting: Physician Assistant

## 2023-04-22 ENCOUNTER — Encounter: Payer: Self-pay | Admitting: Physician Assistant

## 2023-04-22 DIAGNOSIS — Z79899 Other long term (current) drug therapy: Secondary | ICD-10-CM

## 2023-04-22 DIAGNOSIS — M0579 Rheumatoid arthritis with rheumatoid factor of multiple sites without organ or systems involvement: Secondary | ICD-10-CM

## 2023-04-22 NOTE — Progress Notes (Signed)
For reference, xeljanz monitoring  -- When monitoring a patient on Xeljanz (Tofacitinib), it's important to regularly check CBC with differential, hemoglobin/hematocrit, liver function tests (LFTs), platelet count, serum cholesterol profile, serum creatinine/BUN, and conduct a skin cancer screening exam and tuberculin skin test.[1]  Hemoglobin should be monitored at baseline, after 4 to 8 weeks of treatment, and every 3 months thereafter. If the hemoglobin decreases to less than 8 grams/dL or decreases more than 2 grams/dL from baseline, interrupt dosing until hemoglobin values have normalized.  Lymphocyte counts should be monitored at baseline and every 3 months thereafter. Discontinue Xeljanz if the lymphocyte count decreases to less than 500 cells/mm3 during therapy, confirmed by repeat testing.[1]  Neutrophil counts should be monitored at baseline, after 4 to 8 weeks of treatment, and every 3 months thereafter. Discontinue Xeljanz if the ANC falls below 500 cells/mm3, regardless of the indication.[1]  Liver enzyme elevations have been observed in patients treated with Carly Stanley. In patients experiencing liver enzyme elevation, modification of treatment regimen, such as reduction in the dose of concomitant DMARD, interruption of XELJANZ, or reduction in XELJANZ dose, resulted in decrease or normalization of liver enzymes.[2]

## 2023-04-30 LAB — LIPID PANEL
Chol/HDL Ratio: 2.5 ratio (ref 0.0–4.4)
Cholesterol, Total: 173 mg/dL (ref 100–199)
HDL: 68 mg/dL (ref >39)
LDL Chol Calc (NIH): 91 mg/dL (ref 0–99)
Triglycerides: 73 mg/dL (ref 0–149)
VLDL Cholesterol Cal: 14 mg/dL (ref 5–40)

## 2023-04-30 LAB — CBC WITH DIFFERENTIAL/PLATELET
Basophils Absolute: 0.1 10*3/uL (ref 0.0–0.2)
Basos: 1 %
EOS (ABSOLUTE): 0.2 10*3/uL (ref 0.0–0.4)
Eos: 3 %
Hematocrit: 37.3 % (ref 34.0–46.6)
Hemoglobin: 12.2 g/dL (ref 11.1–15.9)
Immature Grans (Abs): 0 10*3/uL (ref 0.0–0.1)
Immature Granulocytes: 0 %
Lymphocytes Absolute: 2 10*3/uL (ref 0.7–3.1)
Lymphs: 29 %
MCH: 30.1 pg (ref 26.6–33.0)
MCHC: 32.7 g/dL (ref 31.5–35.7)
MCV: 92 fL (ref 79–97)
Monocytes Absolute: 0.8 10*3/uL (ref 0.1–0.9)
Monocytes: 12 %
Neutrophils Absolute: 3.9 10*3/uL (ref 1.4–7.0)
Neutrophils: 55 %
Platelets: 308 10*3/uL (ref 150–450)
RBC: 4.05 x10E6/uL (ref 3.77–5.28)
RDW: 12.1 % (ref 11.7–15.4)
WBC: 7.1 10*3/uL (ref 3.4–10.8)

## 2023-04-30 LAB — COMPREHENSIVE METABOLIC PANEL
ALT: 19 IU/L (ref 0–32)
AST: 17 IU/L (ref 0–40)
Albumin: 4.2 g/dL (ref 3.8–4.9)
Alkaline Phosphatase: 63 IU/L (ref 44–121)
BUN/Creatinine Ratio: 16 (ref 9–23)
BUN: 15 mg/dL (ref 6–24)
Bilirubin Total: 0.3 mg/dL (ref 0.0–1.2)
CO2: 23 mmol/L (ref 20–29)
Calcium: 9.5 mg/dL (ref 8.7–10.2)
Chloride: 106 mmol/L (ref 96–106)
Creatinine, Ser: 0.91 mg/dL (ref 0.57–1.00)
Globulin, Total: 2.2 g/dL (ref 1.5–4.5)
Glucose: 89 mg/dL (ref 70–99)
Potassium: 4.5 mmol/L (ref 3.5–5.2)
Sodium: 142 mmol/L (ref 134–144)
Total Protein: 6.4 g/dL (ref 6.0–8.5)
eGFR: 75 mL/min/{1.73_m2} (ref >59)

## 2023-09-16 ENCOUNTER — Encounter: Payer: Self-pay | Admitting: Family

## 2023-09-16 ENCOUNTER — Ambulatory Visit: Payer: Managed Care, Other (non HMO) | Admitting: Family

## 2023-09-16 VITALS — BP 110/65 | HR 74 | Temp 98.6°F | Ht 62.5 in | Wt 186.0 lb

## 2023-09-16 DIAGNOSIS — Z6833 Body mass index (BMI) 33.0-33.9, adult: Secondary | ICD-10-CM | POA: Insufficient documentation

## 2023-09-16 DIAGNOSIS — E559 Vitamin D deficiency, unspecified: Secondary | ICD-10-CM | POA: Diagnosis not present

## 2023-09-16 DIAGNOSIS — M0579 Rheumatoid arthritis with rheumatoid factor of multiple sites without organ or systems involvement: Secondary | ICD-10-CM

## 2023-09-16 DIAGNOSIS — D229 Melanocytic nevi, unspecified: Secondary | ICD-10-CM | POA: Diagnosis not present

## 2023-09-16 DIAGNOSIS — R7303 Prediabetes: Secondary | ICD-10-CM

## 2023-09-16 DIAGNOSIS — R002 Palpitations: Secondary | ICD-10-CM

## 2023-09-16 DIAGNOSIS — N3281 Overactive bladder: Secondary | ICD-10-CM

## 2023-09-16 MED ORDER — WEGOVY 1 MG/0.5ML ~~LOC~~ SOAJ
1.0000 mg | SUBCUTANEOUS | Status: DC
Start: 2023-09-16 — End: 2024-02-28

## 2023-09-16 NOTE — Progress Notes (Unsigned)
New Patient Office Visit  Subjective:  Patient ID: Carly Stanley, female    DOB: [birth year]  Age: 52 y.o. MRN: [id number]  CC:  Chief Complaint  Patient presents with   Establish Care    Has history of RA     HPI Carly Stanley is here to establish care as a new patient.  Oriented to practice routines and expectations.  Prior provider was: Lillia Abed drubel   Pt is without acute concerns.   chronic concerns:  RA: managed by rheumatologist, uses prednisone prn. Uses ibuprofen, can go a few weeks without it at times. She was on celecoxib in the past.   Overactive bladder, if she drinks a good amount of water she notices more urgency than normal and at times will leak. Seems to be stable for the last few years.   PAC's palpitations, sees Dr. Hebert Soho. Uses propanolol prn and daily bystolic 10 mg.   Vitamin d def: Last vitamin D Lab Results  Component Value Date   VD25OH 21.0 (L) 01/12/2023      ROS: Negative unless specifically indicated above in HPI.   Current Outpatient Medications:    cetirizine (ZYRTEC) 10 MG tablet, Take 10 mg by mouth daily., Disp: , Rfl:    ibuprofen (ADVIL) 200 MG tablet, Take by mouth., Disp: , Rfl:    nebivolol (BYSTOLIC) 10 MG tablet, Take 10 mg twice a day, may take extra 10 mg as needed for breakthrough palpitations, Disp: 90 tablet, Rfl: 1   predniSONE (DELTASONE) 5 MG tablet, Take by mouth as needed., Disp: , Rfl:    propranolol (INDERAL) 20 MG tablet, Take 1 tablet (20 mg total) by mouth 3 (three) times daily as needed., Disp: 90 tablet, Rfl: 6   Semaglutide-Weight Management (WEGOVY) 1 MG/0.5ML SOAJ, Inject 1 mg into the skin once a week., Disp: , Rfl:    Tofacitinib Citrate 5 MG TABS, Take 1 tablet by mouth 2 (two) times daily., Disp: , Rfl:  Past Medical History:  Diagnosis Date   GERD (gastroesophageal reflux disease)    PAC (premature atrial contraction)    Pre-diabetes 2016   Rheumatoid arteritis (HCC)    Past Surgical History:   Procedure Laterality Date   CERVICAL DISC SURGERY  2016   CHOLECYSTECTOMY     OVARIAN CYST DRAINAGE     SPINE SURGERY  7yrs.   Neck surgery    Objective:   Today's Vitals: BP 110/65   Pulse 74   Temp 98.6 F (37 C) (Oral)   Ht 5' 2.5" (1.588 m)   Wt 186 lb (84.4 kg)   LMP 11/10/2021 (Approximate)   SpO2 98%   BMI 33.48 kg/m   Physical Exam Constitutional:      General: She is not in acute distress.    Appearance: Normal appearance. She is obese. She is not ill-appearing.  HENT:     Head: Normocephalic.     Right Ear: Tympanic membrane normal.     Left Ear: Tympanic membrane normal.     Nose: Nose normal.     Mouth/Throat:     Mouth: Mucous membranes are moist.  Eyes:     Extraocular Movements: Extraocular movements intact.     Pupils: Pupils are equal, round, and reactive to light.  Cardiovascular:     Rate and Rhythm: Normal rate and regular rhythm.  Pulmonary:     Effort: Pulmonary effort is normal.     Breath sounds: Normal breath sounds.  Abdominal:     General: Abdomen  is flat. Bowel sounds are normal.     Palpations: Abdomen is soft.     Tenderness: There is no guarding or rebound.  Musculoskeletal:        General: Normal range of motion.     Cervical back: Normal range of motion.  Skin:    General: Skin is warm.     Capillary Refill: Capillary refill takes less than 2 seconds.  Neurological:     General: No focal deficit present.     Mental Status: She is alert.  Psychiatric:        Mood and Affect: Mood normal.        Behavior: Behavior normal.        Thought Content: Thought content normal.        Judgment: Judgment normal.     Assessment & Plan:  Rheumatoid arthritis involving multiple sites with positive rheumatoid factor (HCC) Assessment & Plan: Pt requesting possible second opinion from rheumatology.  Referral placed.  However did advise pt to f/u with current rheumatologist to go over lab results and recommendations, as was only seen  once in office and she should f/u to listen to possible treatment plans.  Orders: -     Ambulatory referral to Rheumatology  Body mass index (BMI) of 33.0 to 33.9 in adult -     ZOXWRU; Inject 1 mg into the skin once a week.  Vitamin D deficiency  Multiple nevi Assessment & Plan: Referral placed for derm   Orders: -     Ambulatory referral to Dermatology  Palpitations Assessment & Plan: Cont f/u as scheduled with cardiology Propanolol prn  Continue daily bystolic   OAB (overactive bladder) Assessment & Plan: stable     Follow-up: Return in about 8 months (around 05/15/2024) for f/u CPE.   Mort Sawyers, FNP

## 2023-09-16 NOTE — Patient Instructions (Signed)
  A referral was placed today for dermatology  Please let us know if you have not heard back within 2 weeks about the referral.

## 2023-09-20 ENCOUNTER — Telehealth: Payer: Self-pay | Admitting: Cardiovascular Disease

## 2023-09-20 DIAGNOSIS — D229 Melanocytic nevi, unspecified: Secondary | ICD-10-CM | POA: Insufficient documentation

## 2023-09-20 NOTE — Telephone Encounter (Signed)
*  STAT* If patient is at the pharmacy, call can be transferred to refill team.   1. Which medications need to be refilled? (please list name of each medication and dose if known) nebivolol (BYSTOLIC) 10 MG tablet   2. Which pharmacy/location (including street and city if local pharmacy) is medication to be sent to? [facility] [address] - [place], [place] - [address]   3. Do they need a 30 day or 90 day supply? 90

## 2023-09-20 NOTE — Assessment & Plan Note (Signed)
Pt requesting possible second opinion from rheumatology.  Referral placed.  However did advise pt to f/u with current rheumatologist to go over lab results and recommendations, as was only seen once in office and she should f/u to listen to possible treatment plans.

## 2023-09-20 NOTE — Assessment & Plan Note (Signed)
Refill sent in for wegovy 1 mg weekly

## 2023-09-20 NOTE — Progress Notes (Deleted)
Office Visit Note  Patient: Carly Stanley             Date of Birth: 1971/02/07           MRN: [id number]             PCP: Mort Sawyers, FNP Referring: Mort Sawyers, FNP Visit Date: 09/28/2023 Occupation: @GUAROCC @  Subjective:  No chief complaint on file.   History of Present Illness: Carly Stanley is a 52 y.o. female ***patient scheduled for second opinion.  She is current patient at [facility] rheumatology.  Patient was diagnosed with rheumatoid arthritis in 12/23/ 2020 by Dr. Gerrie Nordmann.  Patient was on Plaquenil and then methotrexate was restarted in December 2020 which she could not tolerate due to nausea.  She was switched to Humira in April 2021 which stopped working soon after.  Rinvoq was added in October 2021 which was discussed continue to an inadequate response.  Orencia started October 2022 discontinued due to an inadequate response.  Actemra February 2023 discontinued due to inadequate response.  Orencia again in December 2023 and was discontinued.  Harriette Ohara was started in February 2024.  Patient currently on Xeljanz 5 mg p.o. twice daily without any side effects and ibuprofen 200 mg every 6 hours as needed.  According to the office visit note from July 07, 2023 she continues to have pain and discomfort and morning stiffness.  She complained of poor grip strength.  Patient has been taking prednisone as needed.  She requested prednisone at that visit.  No synovitis was noted by the examiner on exam.  Her initial x-rays from December 2000 2020 showed degenerative changes only.  Patient was advised to switch to Xeljanz XR 11 mg p.o. daily at the last visit on July 07, 2023.    Activities of Daily Living:  Patient reports morning stiffness for *** {minute/hour:19697}.   Patient {ACTIONS;DENIES/REPORTS:21021675::"Denies"} nocturnal pain.  Difficulty dressing/grooming: {ACTIONS;DENIES/REPORTS:21021675::"Denies"} Difficulty climbing stairs:  {ACTIONS;DENIES/REPORTS:21021675::"Denies"} Difficulty getting out of chair: {ACTIONS;DENIES/REPORTS:21021675::"Denies"} Difficulty using hands for taps, buttons, cutlery, and/or writing: {ACTIONS;DENIES/REPORTS:21021675::"Denies"}  No Rheumatology ROS completed.   PMFS History:  Patient Active Problem List   Diagnosis Date Noted   Multiple nevi 09/20/2023   Vitamin D deficiency 09/16/2023   Body mass index (BMI) of 33.0 to 33.9 in adult 09/16/2023   Rheumatoid arthritis involving multiple sites with positive rheumatoid factor (HCC) 12/16/2022   Palpitations 11/29/2017   PAC (premature atrial contraction) 11/29/2017   OAB (overactive bladder) 09/28/2017   Mixed incontinence 09/28/2017   Pre-diabetes 2016    Past Medical History:  Diagnosis Date   GERD (gastroesophageal reflux disease)    PAC (premature atrial contraction)    Pre-diabetes 2016   Rheumatoid arteritis (HCC)     Family History  Problem Relation Age of Onset   Breast cancer Mother 1   Hyperlipidemia Mother    Hypertension Mother    Anxiety disorder Mother    Hypertension Father    Arthritis Father    Stroke Father    Diabetes Brother    Cancer Maternal Grandmother        Sinus   Lung cancer Paternal Grandfather 62   Anxiety disorder Son    ADD / ADHD Son    Heart attack Paternal Grandmother    Hypertension Paternal Grandmother    Atrial fibrillation Paternal Grandmother    Asthma Son    Past Surgical History:  Procedure Laterality Date   CERVICAL DISC SURGERY  2016   CHOLECYSTECTOMY  OVARIAN CYST DRAINAGE     SPINE SURGERY  43yrs.   Neck surgery   Social History   Social History Narrative   35 and 54 y/o boys (2024)   Immunization History  Administered Date(s) Administered   Influenza,inj,Quad PF,6+ Mos 10/17/2020   Influenza,inj,quad, With Preservative 08/01/2020   Influenza-Unspecified 10/17/2020   Moderna Sars-Covid-2 Vaccination 10/30/2019, 11/30/2019   Td 10/09/2018   Tdap  07/11/2008     Objective: Vital Signs: LMP 11/10/2021 (Approximate)    Physical Exam   Musculoskeletal Exam: ***  CDAI Exam: CDAI Score: -- Patient Global: --; Provider Global: -- Swollen: --; Tender: -- Joint Exam 09/28/2023   No joint exam has been documented for this visit   There is currently no information documented on the homunculus. Go to the Rheumatology activity and complete the homunculus joint exam.  Investigation: No additional findings.  Imaging: No results found.  Recent Labs: Lab Results  Component Value Date   WBC 7.1 04/29/2023   HGB 12.2 04/29/2023   PLT 308 04/29/2023   NA 142 04/29/2023   K 4.5 04/29/2023   CL 106 04/29/2023   CO2 23 04/29/2023   GLUCOSE 89 04/29/2023   BUN 15 04/29/2023   CREATININE 0.91 04/29/2023   BILITOT 0.3 04/29/2023   ALKPHOS 63 04/29/2023   AST 17 04/29/2023   ALT 19 04/29/2023   PROT 6.4 04/29/2023   ALBUMIN 4.2 04/29/2023   CALCIUM 9.5 04/29/2023   GFRAA 78 10/17/2020   December 2020 hepatitis B negative, hepatitis C negative, April 2021 TB Gold negative.  July 07, 2023 x-rays of bilateral hands showed osteoarthritic changes.  There was no evidence of inflammatory arthritis.  No erosions were noted per report. July 07, 2023 CBC WBC 7.5, hemoglobin 13.4, platelets 300   Speciality Comments: Inadequate response to Plaquenil, Humira, Rinvoq, Orencia, Actemra, methotrexate caused nausea.  Harriette Ohara 5 mg p.o. twice daily since February 2024  Procedures:  No procedures performed Allergies: Prednisone   Assessment / Plan:     Visit Diagnoses: Rheumatoid arthritis involving multiple sites with positive rheumatoid factor (HCC) - Positive RF, positive anti-CCP  High risk medication use  Pre-diabetes  PAC (premature atrial contraction)  Mixed incontinence  Multiple nevi  Vitamin D deficiency  Body mass index (BMI) of 33.0 to 33.9 in adult  Orders: No orders of the defined types were placed in  this encounter.  No orders of the defined types were placed in this encounter.   Face-to-face time spent with patient was *** minutes. Greater than 50% of time was spent in counseling and coordination of care.  Follow-Up Instructions: No follow-ups on file.   Pollyann Savoy, MD  Note - This record has been created using Animal nutritionist.  Chart creation errors have been sought, but may not always  have been located. Such creation errors do not reflect on  the standard of medical care.

## 2023-09-20 NOTE — Assessment & Plan Note (Signed)
Referral placed for derm

## 2023-09-20 NOTE — Assessment & Plan Note (Signed)
stable °

## 2023-09-20 NOTE — Telephone Encounter (Signed)
Good Morning,  Could you please schedule this patient a follow up visit? The patient was last seen by Dr. Mariah Milling on 06-05-2021. The patient has the option to get meds refilled through PCP which has been prescribed by her PCP in the past. Thank you so much.

## 2023-09-20 NOTE — Assessment & Plan Note (Signed)
Cont f/u as scheduled with cardiology Propanolol prn  Continue daily bystolic

## 2023-09-21 ENCOUNTER — Encounter: Payer: Self-pay | Admitting: Family

## 2023-09-21 ENCOUNTER — Telehealth: Payer: Self-pay | Admitting: Cardiovascular Disease

## 2023-09-21 DIAGNOSIS — R002 Palpitations: Secondary | ICD-10-CM

## 2023-09-21 NOTE — Telephone Encounter (Signed)
Left voice mail

## 2023-09-21 NOTE — Telephone Encounter (Signed)
Left voicemail, pt needs follow up appt scheduled.The patient was last seen by Dr. Mariah Milling on 06-05-2021. The patient has the option to get meds refilled through PCP which has been prescribed by her PCP in the past. Thank you so much.

## 2023-09-23 MED ORDER — NEBIVOLOL HCL 10 MG PO TABS: ORAL_TABLET | ORAL | 3 refills | Status: DC

## 2023-09-23 NOTE — Addendum Note (Signed)
Addended by: Mort Sawyers on: 09/23/2023 11:30 AM   Modules accepted: Orders

## 2023-09-27 ENCOUNTER — Ambulatory Visit: Payer: Managed Care, Other (non HMO) | Admitting: Cardiology

## 2023-09-28 ENCOUNTER — Encounter: Payer: Managed Care, Other (non HMO) | Admitting: Rheumatology

## 2023-09-28 DIAGNOSIS — E559 Vitamin D deficiency, unspecified: Secondary | ICD-10-CM

## 2023-09-28 DIAGNOSIS — M0579 Rheumatoid arthritis with rheumatoid factor of multiple sites without organ or systems involvement: Secondary | ICD-10-CM

## 2023-09-28 DIAGNOSIS — R7303 Prediabetes: Secondary | ICD-10-CM

## 2023-09-28 DIAGNOSIS — N3946 Mixed incontinence: Secondary | ICD-10-CM

## 2023-09-28 DIAGNOSIS — Z79899 Other long term (current) drug therapy: Secondary | ICD-10-CM

## 2023-09-28 DIAGNOSIS — D229 Melanocytic nevi, unspecified: Secondary | ICD-10-CM

## 2023-09-28 DIAGNOSIS — Z6833 Body mass index (BMI) 33.0-33.9, adult: Secondary | ICD-10-CM

## 2023-09-28 DIAGNOSIS — I491 Atrial premature depolarization: Secondary | ICD-10-CM

## 2023-10-19 ENCOUNTER — Ambulatory Visit: Payer: Managed Care, Other (non HMO) | Admitting: Rheumatology

## 2023-12-14 NOTE — Progress Notes (Unsigned)
 Office Visit Note  Patient: Carly Stanley             Date of Birth: 1971-05-04           MRN: [id number]             PCP: Mort Sawyers, FNP Referring: Mort Sawyers, FNP Visit Date: 12/28/2023 Occupation: @GUAROCC @  Subjective:  Establish care   History of Present Illness: Carly Stanley is a 53 y.o. female with history of seropositive rheumatoid arthritis.  Patient was diagnosed with rheumatoid arthritis in 2020.  Patient reports that her initial symptoms started in her hands.  She has been on several medications including Plaquenil, methotrexate, Orencia, Humira, Actemra, and rinvoq.  Patient was started on Xeljanz 5 mg 1 tablet twice daily in February 2024.  She has been tolerating Harriette Ohara without any side effects but was recently switched to Xeljanz 11 mg XR daily which she has found to be more effective.  Overall her rheumatoid arthritis has remained well-controlled while taking Harriette Ohara.  She denies any recent gaps in therapy.  She continues to experience some stiffness in both hands first thing in the mornings.  She presents today with discomfort on the lateral aspect of her left hip consistent with trochanteric bursitis.  Patient states that at times she has muscle weakness especially when rising from a squatting position.  She also has some sensitivity of her skin and connective tissue which she has noticed intermittently.      Activities of Daily Living:  Patient reports morning stiffness for 1 hour.   Patient Reports nocturnal pain.  Difficulty dressing/grooming: Denies Difficulty climbing stairs: Reports Difficulty getting out of chair: Reports Difficulty using hands for taps, buttons, cutlery, and/or writing: Reports  Review of Systems  Constitutional:  Negative for fatigue.  HENT:  Negative for mouth sores, mouth dryness and nose dryness.   Eyes:  Negative for pain and dryness.  Respiratory:  Negative for shortness of breath and difficulty breathing.   Cardiovascular:   Negative for chest pain and palpitations.  Gastrointestinal:  Negative for blood in stool, constipation and diarrhea.  Endocrine: Negative for increased urination.  Genitourinary:  Negative for involuntary urination.  Musculoskeletal:  Positive for joint pain, gait problem, joint pain, joint swelling, myalgias, muscle weakness, morning stiffness and myalgias. Negative for muscle tenderness.  Skin:  Negative for color change, rash, hair loss and sensitivity to sunlight.  Allergic/Immunologic: Negative for susceptible to infections.  Neurological:  Positive for headaches. Negative for dizziness.  Hematological:  Negative for swollen glands.  Psychiatric/Behavioral:  Positive for sleep disturbance. Negative for depressed mood. The patient is not nervous/anxious.     PMFS History:  Patient Active Problem List   Diagnosis Date Noted  . Multiple nevi 09/20/2023  . Vitamin D deficiency 09/16/2023  . Body mass index (BMI) of 33.0 to 33.9 in adult 09/16/2023  . Rheumatoid arthritis involving multiple sites with positive rheumatoid factor (HCC) 12/16/2022  . Palpitations 11/29/2017  . PAC (premature atrial contraction) 11/29/2017  . OAB (overactive bladder) 09/28/2017  . Mixed incontinence 09/28/2017  . Pre-diabetes 2016    Past Medical History:  Diagnosis Date  . GERD (gastroesophageal reflux disease)   . PAC (premature atrial contraction)   . Pre-diabetes 2016  . Rheumatoid arteritis (HCC)     Family History  Problem Relation Age of Onset  . Breast cancer Mother 71  . Hyperlipidemia Mother   . Hypertension Mother   . Anxiety disorder Mother   . Hypertension Father   .  Stroke Father   . Heart disease Father        valve replacement  . Osteoarthritis Father   . Diabetes Brother   . Cancer Maternal Grandmother        Sinus  . Heart attack Paternal Grandmother   . Hypertension Paternal Grandmother   . Atrial fibrillation Paternal Grandmother   . Lung cancer Paternal Grandfather  58  . Anxiety disorder Son   . ADD / ADHD Son   . Asthma Son   . Rheum arthritis Paternal Great-grandfather    Past Surgical History:  Procedure Laterality Date  . CERVICAL DISC SURGERY  2016  . CHOLECYSTECTOMY    . OVARIAN CYST DRAINAGE     Social History   Social History Narrative   30 and 55 y/o boys (2024)   Immunization History  Administered Date(s) Administered  . Influenza,inj,Quad PF,6+ Mos 10/17/2020  . Influenza,inj,quad, With Preservative 08/01/2020  . Influenza-Unspecified 10/17/2020  . Moderna Sars-Covid-2 Vaccination 10/30/2019, 11/30/2019  . Td 10/09/2018  . Tdap 07/11/2008     Objective: Vital Signs: BP 126/87 (BP Location: Right Arm, Patient Position: Sitting, Cuff Size: Large)   Pulse (!) 56   Resp 16   Ht 5\' 2"  (1.575 m)   Wt 188 lb 3.2 oz (85.4 kg)   LMP 11/10/2021 (Approximate)   BMI 34.42 kg/m    Physical Exam Vitals and nursing note reviewed.  Constitutional:      Appearance: She is well-developed.  HENT:     Head: Normocephalic and atraumatic.  Eyes:     Conjunctiva/sclera: Conjunctivae normal.  Cardiovascular:     Rate and Rhythm: Normal rate and regular rhythm.     Heart sounds: Normal heart sounds.  Pulmonary:     Effort: Pulmonary effort is normal.     Breath sounds: Normal breath sounds.  Abdominal:     General: Bowel sounds are normal.     Palpations: Abdomen is soft.  Musculoskeletal:     Cervical back: Normal range of motion.  Lymphadenopathy:     Cervical: No cervical adenopathy.  Skin:    General: Skin is warm and dry.     Capillary Refill: Capillary refill takes less than 2 seconds.  Neurological:     Mental Status: She is alert and oriented to person, place, and time.  Psychiatric:        Behavior: Behavior normal.     Musculoskeletal Exam: C-spine, thoracic spine, lumbar spine have good range of motion.  Shoulder joints, elbow joints, wrist joints, MCPs, PIPs, DIPs have good range of motion with no synovitis.   Complete fist formation bilaterally.  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  Complete fist formation bilaterally.  Hip joints have good range of motion with no groin pain.  Tenderness over the left trochanteric bursa.  Knee joints have good range of motion with no warmth or effusion.  Ankle joints have good range of motion with no tenderness or joint swelling.  No evidence of Achilles tendinitis or plantar fasciitis.  No tenderness or synovitis of MTP joints.  CDAI Exam: CDAI Score: -- Patient Global: --; Provider Global: -- Swollen: --; Tender: -- Joint Exam 12/28/2023   No joint exam has been documented for this visit   There is currently no information documented on the homunculus. Go to the Rheumatology activity and complete the homunculus joint exam.  Investigation: No additional findings.  Imaging: No results found.  Recent Labs: Lab Results  Component Value Date  WBC 7.1 04/29/2023   HGB 12.2 04/29/2023   PLT 308 04/29/2023   NA 142 04/29/2023   K 4.5 04/29/2023   CL 106 04/29/2023   CO2 23 04/29/2023   GLUCOSE 89 04/29/2023   BUN 15 04/29/2023   CREATININE 0.91 04/29/2023   BILITOT 0.3 04/29/2023   ALKPHOS 63 04/29/2023   AST 17 04/29/2023   ALT 19 04/29/2023   PROT 6.4 04/29/2023   ALBUMIN 4.2 04/29/2023   CALCIUM 9.5 04/29/2023   GFRAA 78 10/17/2020    Speciality Comments: Inadequate response to Plaquenil, Humira, Rinvoq, Orencia, Actemra, methotrexate caused nausea.  Harriette Ohara 5 mg p.o. twice daily since February 2024  Procedures:  No procedures performed Allergies: Patient has no active allergies.      Treatment history: - Prednisone PRN - HCQ 200 mg BID  - Methotrexate started 10/2019, did not tolerate 20 mg PO weekly due to nausea. - Humira started 01/2020. Worked for a short period, but symptoms returned, so discontinued. - Rinvoq started 08/2020 and discontinued as it did not help. - Orencia started 08/2021 and  discontinued as it did not help. - Actemra started 12/2021 and discontinued as it did not help. - Tried Orencia again 10/2022 but discontinued as it did not help. Harriette Ohara started 12/2022   Assessment / Plan:     Visit Diagnoses: Rheumatoid arthritis involving multiple sites with positive rheumatoid factor (HCC) - RF 87.3 and CCP >250. Dx December 2020-sx started in both hands. Under care of [facility] and [facility]. Previous therapy includes: Prednisone as needed, hydroxychloroquine 200 mg twice daily, methotrexate 20 mg weekly-d/c nausea, Humira-waning effect, Rinvoq-waning effect, Orencia inadequate response, Actemra inadequate response, Xeljanz started in February 2024.  Baseline x-rays of both hands obtained on 10/24/2019 at [facility].  X-rays of both hands updated by [facility] on 07/07/2023-no erosive changes.   Overall the patient's symptoms have been stable taking Xeljanz 11 mg XR daily.  She is tolerating Harriette Ohara without any side effects.  She has not had any recent gaps in therapy.  She takes ibuprofen as needed for symptomatic relief.  No synovitis noted on examination today.  Declined x-rays of both feet since she is not currently symptomatic.  No history of nodulosis.  No baseline CXR obtained.  No recent or recurrent infections.   Plan to keep the patient on Xeljanz as prescribed.  Reviewed indications, contraindications, potential side effects of Harriette Ohara today in detail.  All questions were addressed and consent was updated. The following lab work will be updated today for further evaluation.  A baseline chest x-[place] will also be obtained. Association of heart disease with rheumatoid arthritis was discussed. Need to monitor blood pressure, cholesterol, and to exercise 30-60 minutes on daily basis was discussed.  She will follow up in the office in 6-8 weeks or sooner if needed.   - Plan: Lipid panel, Rheumatoid factor, Cyclic citrul peptide antibody, IgG, Sedimentation rate, C-reactive protein, Serum protein  electrophoresis with reflex, DG Chest 2 View   Medication counseling: Baseline Immunosuppressant Therapy Labs     Latest Ref Rng & Units 04/29/2023    8:45 AM  Serum Protein Electrophoresis  Total Protein 6.0 - 8.5 g/dL 6.4    LIPIDS Lab Results  Component Value Date   CHOL 173 04/29/2023   HDL 68 04/29/2023   LDLCALC 91 04/29/2023   TRIG 73 04/29/2023   CHOLHDL 2.5 04/29/2023     Chest x-[place]: Pending  Does patient have history of diverticulitis?  No  Counseled patient  that Harriette Ohara is a JAK inhibitor.  Counseled patient on purpose, proper use, and adverse effects of Xeljanz.  Reviewed the most common adverse effects including infection, diarrhea, headaches. Counseled on the increased risk of DVT/PE.  Also reviewed rare adverse effects such as bowel injury and the need to contact us if develops stomach pain during treatment.  Reviewed with patient that there is the possibility of an increased risk of malignancy but it is not well understood if this increased risk is due to the medication or the disease state.  Counseled that Harriette Ohara should be held for infection and prior to surgery.  Counseled patient to avoid live vaccines while on [place].  Recommend annual influenza, Pneumovax 23, Prevnar 13, and Shingrix as indicated.   Reviewed importance of routine lab monitoring including a lipid panel.  Standing orders placed and patient is to return in 1 month and then every 3 months after initiation. Provided patient with medication education material and answered all questions.  Patient consented to [place].  Will upload Harriette Ohara into patient's chart.  Will apply through patient's insurance and update when we receive a response.  Patient dose will be 11 mg XR daily.  Prescription will be sent to pharmacy pending lab results and/or insurance approval.  High risk medication use -Xeljanz 11 mg XR 1 tablet by mouth daily--consent updated today.  Xeljanz 5 mg 1 tablet twice daily was initiated in  February 2024.  Tolerating Harriette Ohara. CBC and CMP ordered released.  She will continue to require updated lab work every 3 months.  Standing orders for CBC and CMP were placed today. Order for TB gold released today Hepatitis B surface antigen negative 10/24/2019 Hepatitis C antibody negative on 10/24/2019 No baseline CXR--order for chest x-[place] placed today. Lipid panel 04/29/23.  Patient is not currently fasting.  Future order for a lipid panel was placed today for [facility].  No recent or recurrent infections.  Discussed the importance of holding xeljanz if she develops signs or symptoms of an infection and to resume once the infection has completely cleared.   Patient does have a history of shingles.  Encouraged the patient to have the Shingrix vaccine series performed.  Recommended remaining up-to-date with recommended vaccinations. - Plan: CBC with Differential/Platelet, COMPLETE METABOLIC PANEL WITH GFR, QuantiFERON-TB Gold Plus, Lipid panel, CBC with Differential/Platelet, CMP14+EGFR, DG Chest 2 View  Screening for tuberculosis - Order for TB gold released today.  Plan: QuantiFERON-TB Gold Plus  Lipid screening: Patient is not currently fasting. Future order for lipid panel placed today.   Pain in both hands: X-rays of both hands updated on 07/07/23: mild osteoarthrosis at scattered interphalangeal joints with narrowing, sclerosis, osteophytosis and small ossicles. No joint narrowing, erosion or articular malalignment is seen to suggest an inflammatory or erosive arthropathy. No evidence for fracture or dislocation. Bone density is preserved.  Patient experiences stiffness in both hands first thing in the mornings.  She has no synovitis on examination today.  Patient remains on Harriette Ohara as prescribed. Plan to update x-rays in 2-3 years to assess for radiographic progression.   Trochanteric bursitis, left hip: Patient presents today with tenderness on the lateral aspect of the left hip consistent  with trochanteric bursitis.  Different treatment options were discussed today.  She was given a handout of exercises to perform.  If her symptoms persist or worsen she can return for a left trochanteric bursa cortisone injection in the future.  Muscle weakness - She experiences intermittent muscle weakness especially when rising from a  squatting position.  She has also been experiencing muscle tenderness consistent with myofascial pain.  Plan to check CK today.  Plan: CK  Myofascial pain - Plan to check CK today.  Plan: CK  Other fatigue -Plan to obtain the following lab work today. Plan: Sedimentation rate, C-reactive protein, Serum protein electrophoresis with reflex  Other medical conditions are listed as follows:   PAC (premature atrial contraction) - Holter monitor-PACs and PVCs--Taking bystolic-well-controlled.  Palpitations: Well-controlled with the use of Bystolic  OAB (overactive bladder)  Mixed incontinence  Multiple nevi  Pre-diabetes - Hgb A1C 5.7% on 01/12/23  Vitamin D deficiency: She is taking an over-the-counter maintenance dose of vitamin D as recommended by her PCP.  History of shingles: Recommended receiving the Shingrix vaccine series.    Orders: Orders Placed This Encounter  Procedures  . DG Chest 2 View  . CBC with Differential/Platelet  . COMPLETE METABOLIC PANEL WITH GFR  . QuantiFERON-TB Gold Plus  . CK  . Lipid panel  . CBC with Differential/Platelet  . CMP14+EGFR  . Rheumatoid factor  . Cyclic citrul peptide antibody, IgG  . Sedimentation rate  . C-reactive protein  . Serum protein electrophoresis with reflex  . IgG, IgA, IgM   No orders of the defined types were placed in this encounter.     Follow-Up Instructions: Return in about 2 months (around 02/25/2024) for Rheumatoid arthritis.   Gearldine Bienenstock, PA-C  Note - This record has been created using Dragon software.  Chart creation errors have been sought, but may not always  have  been located. Such creation errors do not reflect on  the standard of medical care.

## 2023-12-26 ENCOUNTER — Encounter: Payer: Self-pay | Admitting: Dermatology

## 2023-12-26 ENCOUNTER — Ambulatory Visit: Payer: Managed Care, Other (non HMO) | Admitting: Dermatology

## 2023-12-26 DIAGNOSIS — Z808 Family history of malignant neoplasm of other organs or systems: Secondary | ICD-10-CM

## 2023-12-26 DIAGNOSIS — D492 Neoplasm of unspecified behavior of bone, soft tissue, and skin: Secondary | ICD-10-CM

## 2023-12-26 DIAGNOSIS — L821 Other seborrheic keratosis: Secondary | ICD-10-CM

## 2023-12-26 DIAGNOSIS — L814 Other melanin hyperpigmentation: Secondary | ICD-10-CM

## 2023-12-26 DIAGNOSIS — C44319 Basal cell carcinoma of skin of other parts of face: Secondary | ICD-10-CM | POA: Diagnosis not present

## 2023-12-26 DIAGNOSIS — D1801 Hemangioma of skin and subcutaneous tissue: Secondary | ICD-10-CM

## 2023-12-26 DIAGNOSIS — L905 Scar conditions and fibrosis of skin: Secondary | ICD-10-CM

## 2023-12-26 DIAGNOSIS — D229 Melanocytic nevi, unspecified: Secondary | ICD-10-CM

## 2023-12-26 DIAGNOSIS — Z1283 Encounter for screening for malignant neoplasm of skin: Secondary | ICD-10-CM

## 2023-12-26 DIAGNOSIS — C4491 Basal cell carcinoma of skin, unspecified: Secondary | ICD-10-CM

## 2023-12-26 DIAGNOSIS — W908XXA Exposure to other nonionizing radiation, initial encounter: Secondary | ICD-10-CM

## 2023-12-26 DIAGNOSIS — L578 Other skin changes due to chronic exposure to nonionizing radiation: Secondary | ICD-10-CM

## 2023-12-26 HISTORY — DX: Basal cell carcinoma of skin, unspecified: C44.91

## 2023-12-26 NOTE — Patient Instructions (Addendum)
 Wound Care Instructions  Cleanse wound gently with soap and water once a day then pat dry with clean gauze. Apply a thin coat of Petrolatum (petroleum jelly, "Vaseline") over the wound (unless you have an allergy to this). We recommend that you use a new, sterile tube of Vaseline. Do not pick or remove scabs. Do not remove the yellow or white "healing tissue" from the base of the wound.  Cover the wound with fresh, clean, nonstick gauze and secure with paper tape. You may use Band-Aids in place of gauze and tape if the wound is small enough, but would recommend trimming much of the tape off as there is often too much. Sometimes Band-Aids can irritate the skin.  You should call the office for your biopsy report after 1 week if you have not already been contacted.  If you experience any problems, such as abnormal amounts of bleeding, swelling, significant bruising, significant pain, or evidence of infection, please call the office immediately.  FOR ADULT SURGERY PATIENTS: If you need something for pain relief you may take 1 extra strength Tylenol (acetaminophen) AND 2 Ibuprofen (200mg  each) together every 4 hours as needed for pain. (do not take these if you are allergic to them or if you have a reason you should not take them.) Typically, you may only need pain medication for 1 to 3 days.     Melanoma ABCDEs  Melanoma is the most dangerous type of skin cancer, and is the leading cause of death from skin disease.  You are more likely to develop melanoma if you: Have light-colored skin, light-colored eyes, or red or blond hair Spend a lot of time in the sun Tan regularly, either outdoors or in a tanning bed Have had blistering sunburns, especially during childhood Have a close family member who has had a melanoma Have atypical moles or large birthmarks  Early detection of melanoma is key since treatment is typically straightforward and cure rates are extremely high if we catch it early.   The  first sign of melanoma is often a change in a mole or a new dark spot.  The ABCDE system is a way of remembering the signs of melanoma.  A for asymmetry:  The two halves do not match. B for border:  The edges of the growth are irregular. C for color:  A mixture of colors are present instead of an even brown color. D for diameter:  Melanomas are usually (but not always) greater than 6mm - the size of a pencil eraser. E for evolution:  The spot keeps changing in size, shape, and color.  Please check your skin once per month between visits. You can use a small mirror in front and a large mirror behind you to keep an eye on the back side or your body.   If you see any new or changing lesions before your next follow-up, please call to schedule a visit.  Please continue daily skin protection including broad spectrum sunscreen SPF 30+ to sun-exposed areas, reapplying every 2 hours as needed when you're outdoors.   Staying in the shade or wearing long sleeves, sun glasses (UVA+UVB protection) and wide brim hats (4-inch brim around the entire circumference of the hat) are also recommended for sun protection.    Due to recent changes in healthcare laws, you may see results of your pathology and/or laboratory studies on MyChart before the doctors have had a chance to review them. We understand that in some cases there may be  results that are confusing or concerning to you. Please understand that not all results are received at the same time and often the doctors may need to interpret multiple results in order to provide you with the best plan of care or course of treatment. Therefore, we ask that you please give [place] 2 business days to thoroughly review all your results before contacting the office for clarification. Should we see a critical lab result, you will be contacted sooner.   If You Need Anything After Your Visit  If you have any questions or concerns for your doctor, please call our main line at  [phone] and press option 4 to reach your doctor's medical assistant. If no one answers, please leave a voicemail as directed and we will return your call as soon as possible. Messages left after 4 pm will be answered the following business day.   You may also send [place] a message via MyChart. We typically respond to MyChart messages within 1-2 business days.  For prescription refills, please ask your pharmacy to contact our office. Our fax number is [phone].  If you have an urgent issue when the clinic is closed that cannot wait until the next business day, you can page your doctor at the number below.    Please note that while we do our best to be available for urgent issues outside of office hours, we are not available 24/7.   If you have an urgent issue and are unable to reach [place], you may choose to seek medical care at your doctor's office, retail clinic, urgent care center, or emergency room.  If you have a medical emergency, please immediately call 911 or go to the emergency department.  Pager Numbers  - Dr. Gwen Pounds: [phone]  - Dr. Roseanne Reno: [phone]  - Dr. Katrinka Blazing: [phone]   In the event of inclement weather, please call our main line at [phone] for an update on the status of any delays or closures.  Dermatology Medication Tips: Please keep the boxes that topical medications come in in order to help keep track of the instructions about where and how to use these. Pharmacies typically print the medication instructions only on the boxes and not directly on the medication tubes.   If your medication is too expensive, please contact our office at [phone] option 4 or send [place] a message through MyChart.   We are unable to tell what your co-pay for medications will be in advance as this is different depending on your insurance coverage. However, we may be able to find a substitute medication at lower cost or fill out paperwork to get insurance to cover a needed  medication.   If a prior authorization is required to get your medication covered by your insurance company, please allow [place] 1-2 business days to complete this process.  Drug prices often vary depending on where the prescription is filled and some pharmacies may offer cheaper prices.  The website www.goodrx.com contains coupons for medications through different pharmacies. The prices here do not account for what the cost may be with help from insurance (it may be cheaper with your insurance), but the website can give you the price if you did not use any insurance.  - You can print the associated coupon and take it with your prescription to the pharmacy.  - You may also stop by our office during regular business hours and pick up a GoodRx coupon card.  - If you need your prescription sent electronically to a  different pharmacy, notify our office through [employer] or by phone at [phone] option 4.     Si Usted Necesita Algo Despus de Su Visita  Tambin puede enviarnos un mensaje a travs de Clinical cytogeneticist. Por lo general respondemos a los mensajes de MyChart en el transcurso de 1 a 2 das hbiles.  Para renovar recetas, por favor pida a su farmacia que se ponga en contacto con nuestra oficina. Annie Sable de fax es [place] [phone].  Si tiene un asunto urgente cuando la clnica est cerrada y que no puede esperar hasta el siguiente da hbil, puede llamar/localizar a su doctor(a) al nmero que aparece a continuacin.   Por favor, tenga en cuenta que aunque hacemos todo lo posible para estar disponibles para asuntos urgentes fuera del horario de [place], no estamos disponibles las 24 horas del da, los 7 [address]  [address] de la [place].   Si tiene un problema urgente y no puede comunicarse con nosotros, puede optar por buscar atencin mdica  en el consultorio de su doctor(a), en una clnica privada, en un centro de atencin urgente o en una sala de emergencias.  Si tiene Engineer, drilling,  por favor llame inmediatamente al 911 o vaya a la sala de emergencias.  Nmeros de bper  - Dr. Gwen Pounds: [phone]  - Dra. Roseanne Reno: [phone]  - Dr. Katrinka Blazing: [phone]   En caso de inclemencias del tiempo, por favor llame a Lacy Duverney principal al [phone] para una actualizacin sobre el [place] de cualquier retraso o cierre.  Consejos para la medicacin en dermatologa: Por favor, guarde las cajas en las que vienen los medicamentos de uso tpico para ayudarle a seguir las instrucciones sobre dnde y cmo usarlos. Las farmacias generalmente imprimen las instrucciones del medicamento slo en las cajas y no directamente en los tubos del [place].   Si su medicamento es muy caro, por favor, pngase en contacto con Rolm Gala llamando al [phone] y presione la opcin 4 o envenos un mensaje a travs de Clinical cytogeneticist.   No podemos decirle cul ser su copago por los medicamentos por adelantado ya que esto es diferente dependiendo de la cobertura de su seguro. Sin embargo, es posible que podamos encontrar un medicamento sustituto a Audiological scientist un formulario para que el seguro cubra el medicamento que se considera necesario.   Si se requiere una autorizacin previa para que su compaa de seguros [place] su medicamento, por favor permtanos de 1 a 2 das hbiles para completar [address]  Los precios de los medicamentos varan con frecuencia dependiendo del Environmental consultant de dnde se surte la receta y alguna farmacias pueden ofrecer precios ms baratos.  El sitio web www.goodrx.com tiene cupones para medicamentos de Health and safety inspector. Los precios aqu no tienen en cuenta lo que podra costar con la ayuda del seguro (puede ser ms barato con su seguro), pero el sitio web puede darle el precio si no utiliz Tourist information centre manager.  - Puede imprimir el cupn correspondiente y llevarlo con su receta a la farmacia.  - Tambin puede pasar por nuestra oficina durante el horario de atencin  regular y Education officer, museum una tarjeta de cupones de GoodRx.  - Si necesita que su receta se enve electrnicamente a una farmacia diferente, informe a nuestra oficina a travs de MyChart de [place] o por telfono llamando al [phone] y presione la opcin 4.

## 2023-12-26 NOTE — Progress Notes (Signed)
 New Patient Visit   Subjective  Carly Stanley is a 53 y.o. female who presents for the following: Skin Cancer Screening and Full Body Skin Exam, pt reports family hx of skin cancer: father (BCC & SCC), pt denies any personal hx of skin cancers. Pt does have place at L temple, rough to the touch has been there a while.   The patient presents for Total-Body Skin Exam (TBSE) for skin cancer screening and mole check. The patient has spots, moles and lesions to be evaluated, some may be new or changing and the patient may have concern these could be cancer.    The following portions of the chart were reviewed this encounter and updated as appropriate: medications, allergies, medical history  Review of Systems:  No other skin or systemic complaints except as noted in HPI or Assessment and Plan.  Objective  Well appearing patient in no apparent distress; mood and affect are within normal limits.  A full examination was performed including scalp, head, eyes, ears, nose, lips, neck, chest, axillae, abdomen, back, buttocks, bilateral upper extremities, bilateral lower extremities, hands, feet, fingers, toes, fingernails, and toenails. All findings within normal limits unless otherwise noted below.   Relevant physical exam findings are noted in the Assessment and Plan.  Left Temple 12 mm pink scaly plaque    Assessment & Plan   SKIN CANCER SCREENING PERFORMED TODAY.  ACTINIC DAMAGE - Chronic condition, secondary to cumulative UV/sun exposure - diffuse scaly erythematous macules with underlying dyspigmentation - Recommend daily broad spectrum sunscreen SPF 30+ to sun-exposed areas, reapply every 2 hours as needed.  - Staying in the shade or wearing long sleeves, sun glasses (UVA+UVB protection) and wide brim hats (4-inch brim around the entire circumference of the hat) are also recommended for sun protection.  - Call for new or changing lesions.  LENTIGINES, SEBORRHEIC KERATOSES, HEMANGIOMAS  - Benign normal skin lesions - Benign-appearing - Call for any changes - discussed cosmetic removal of lentigines (cryo laser), patient defers today  MELANOCYTIC NEVI - Tan-brown and/or pink-flesh-colored symmetric macules and papules - Benign appearing on exam today - Observation - Call clinic for new or changing moles - Recommend daily use of broad spectrum spf 30+ sunscreen to sun-exposed areas.   FAMILY HISTORY OF SKIN CANCER What type(s): BCC & SCC Who affected: Father    HEMANGIOMA Exam: red papule(s) Discussed benign nature. Recommend observation. Call for changes.    SCAR- L lower back well-healed scar with re-pigmentation, hx of mole removal  Exam: Dyspigmented smooth macule or patch. Benign-appearing.  Observation.  Call clinic for new or changing lesions. Recommend daily broad spectrum sunscreen SPF 30+, reapply every 2 hours as needed. Treatment: Recommend Serica moisturizing scar formula cream every night or [facility] brand or Mederma silicone scar sheet every night for the first year after a scar appears to help with scar remodeling if desired. Scars remodel on their own for a full year and will gradually improve in appearance over time.   NEOPLASM OF SKIN Left Temple Skin / nail biopsy Type of biopsy: tangential   Informed consent: discussed and consent obtained   Timeout: patient name, date of birth, surgical site, and procedure verified   Procedure prep:  Patient was prepped and draped in usual sterile fashion Prep type:  Isopropyl alcohol Anesthesia: the lesion was anesthetized in a standard fashion   Anesthetic:  1% lidocaine w/ epinephrine 1-100,000 buffered w/ 8.4% NaHCO3 Instrument used: DermaBlade   Hemostasis achieved with: pressure and aluminum  chloride   Outcome: patient tolerated procedure well   Post-procedure details: sterile dressing applied and wound care instructions given   Dressing type: bandage and petrolatum   Specimen 1 - Surgical  pathology Differential Diagnosis: AK vs SCC vs ISK  Check Margins: No 12 mm pink scaly plaque MULTIPLE BENIGN NEVI   SEBORRHEIC KERATOSES   CHERRY ANGIOMA   LENTIGINES   ACTINIC ELASTOSIS    Return in about 1 year (around 12/25/2024) for w/ Dr. Katrinka Blazing, TBSE.  I, Soundra Pilon, CMA, am acting as scribe for Elie Goody, MD .  Documentation: I have reviewed the above documentation for accuracy and completeness, and I agree with the above.  Elie Goody, MD

## 2023-12-27 ENCOUNTER — Ambulatory Visit: Payer: Managed Care, Other (non HMO) | Admitting: Dermatology

## 2023-12-28 ENCOUNTER — Encounter: Payer: Self-pay | Admitting: Physician Assistant

## 2023-12-28 ENCOUNTER — Ambulatory Visit: Payer: Managed Care, Other (non HMO) | Attending: Physician Assistant | Admitting: Physician Assistant

## 2023-12-28 ENCOUNTER — Ambulatory Visit
Admission: RE | Admit: 2023-12-28 | Discharge: 2023-12-28 | Disposition: A | Discharge status: Home / Self Care | Payer: Managed Care, Other (non HMO) | Source: Ambulatory Visit | Attending: Physician Assistant | Admitting: Physician Assistant

## 2023-12-28 ENCOUNTER — Ambulatory Visit
Admission: RE | Admit: 2023-12-28 | Discharge: 2023-12-28 | Disposition: A | Discharge status: Home / Self Care | Payer: Managed Care, Other (non HMO) | Source: Ambulatory Visit | Attending: Physician Assistant

## 2023-12-28 VITALS — BP 126/87 | HR 56 | Resp 16 | Ht 62.0 in | Wt 188.2 lb

## 2023-12-28 DIAGNOSIS — D229 Melanocytic nevi, unspecified: Secondary | ICD-10-CM

## 2023-12-28 DIAGNOSIS — Z13220 Encounter for screening for lipoid disorders: Secondary | ICD-10-CM

## 2023-12-28 DIAGNOSIS — M0579 Rheumatoid arthritis with rheumatoid factor of multiple sites without organ or systems involvement: Secondary | ICD-10-CM | POA: Diagnosis not present

## 2023-12-28 DIAGNOSIS — M7918 Myalgia, other site: Secondary | ICD-10-CM

## 2023-12-28 DIAGNOSIS — N3281 Overactive bladder: Secondary | ICD-10-CM

## 2023-12-28 DIAGNOSIS — R7303 Prediabetes: Secondary | ICD-10-CM

## 2023-12-28 DIAGNOSIS — Z79899 Other long term (current) drug therapy: Secondary | ICD-10-CM

## 2023-12-28 DIAGNOSIS — N3946 Mixed incontinence: Secondary | ICD-10-CM

## 2023-12-28 DIAGNOSIS — Z111 Encounter for screening for respiratory tuberculosis: Secondary | ICD-10-CM

## 2023-12-28 DIAGNOSIS — E559 Vitamin D deficiency, unspecified: Secondary | ICD-10-CM

## 2023-12-28 DIAGNOSIS — R5383 Other fatigue: Secondary | ICD-10-CM

## 2023-12-28 DIAGNOSIS — M6281 Muscle weakness (generalized): Secondary | ICD-10-CM

## 2023-12-28 DIAGNOSIS — I491 Atrial premature depolarization: Secondary | ICD-10-CM | POA: Diagnosis not present

## 2023-12-28 DIAGNOSIS — M7062 Trochanteric bursitis, left hip: Secondary | ICD-10-CM

## 2023-12-28 DIAGNOSIS — M79642 Pain in left hand: Secondary | ICD-10-CM

## 2023-12-28 DIAGNOSIS — M79641 Pain in right hand: Secondary | ICD-10-CM

## 2023-12-28 DIAGNOSIS — R002 Palpitations: Secondary | ICD-10-CM

## 2023-12-28 DIAGNOSIS — Z8619 Personal history of other infectious and parasitic diseases: Secondary | ICD-10-CM

## 2023-12-28 LAB — SURGICAL PATHOLOGY

## 2023-12-28 NOTE — Patient Instructions (Addendum)
 Hip Bursitis Rehab Ask your health care provider which exercises are safe for you. Do exercises exactly as told by your health care provider and adjust them as directed. It is normal to feel mild stretching, pulling, tightness, or discomfort as you do these exercises. Stop right away if you feel sudden pain or your pain gets worse. Do not begin these exercises until told by your health care provider. Stretching exercise This exercise warms up your muscles and joints and improves the movement and flexibility of your hip. This exercise also helps to relieve pain and stiffness. Iliotibial band stretch An iliotibial band is a strong band of muscle tissue that runs from the outer side of your hip to the outer side of your thigh and knee. Lie on your side with your left / right leg in the top position. Bend your left / right knee and grab your ankle. Stretch out your bottom arm to help you balance. Slowly bring your knee back so your thigh is slightly behind your body. Slowly lower your knee toward the floor until you feel a gentle stretch on the outside of your left / right thigh. If you do not feel a stretch and your knee will not lower more toward the floor, place the heel of your other foot on top of your knee and pull your knee down toward the floor with your foot. Hold this position for __________ seconds. Slowly return to the starting position. Repeat __________ times. Complete this exercise __________ times a day. Strengthening exercises These exercises build strength and endurance in your hip and pelvis. Endurance is the ability to use your muscles for a long time, even after they get tired. Bridge This exercise strengthens the muscles that move your thigh backward (hip extensors). Lie on your back on a firm surface with your knees bent and your feet flat on the floor. Tighten your buttocks muscles and lift your buttocks off the floor until your trunk is level with your thighs. Do not arch your  back. You should feel the muscles working in your buttocks and the back of your thighs. If you do not feel these muscles, slide your feet 1-2 inches (2.5-5 cm) farther away from your buttocks. If this exercise is too easy, try doing it with your arms crossed over your chest. Hold this position for __________ seconds. Slowly lower your hips to the starting position. Let your muscles relax completely after each repetition. Repeat __________ times. Complete this exercise __________ times a day. Squats This exercise strengthens the muscles in front of your thigh and knee (quadriceps). Stand in front of a table, with your feet and knees pointing straight ahead. You may rest your hands on the table for balance but not for support. Slowly bend your knees and lower your hips like you are going to sit in a chair. Keep your weight over your heels, not over your toes. Keep your lower legs upright so they are parallel with the table legs. Do not let your hips go lower than your knees. Do not bend lower than told by your health care provider. If your hip pain increases, do not bend as low. Hold the squat position for __________ seconds. Slowly push with your legs to return to standing. Do not use your hands to pull yourself to standing. Repeat __________ times. Complete this exercise __________ times a day. Hip hike  Stand sideways on a bottom step. Stand on your left / right leg with your other foot unsupported next to  the step. You can hold on to the railing or wall for balance if needed. Keep your knees straight and your torso square. Then lift your left / right hip up toward the ceiling. Hold this position for __________ seconds. Slowly let your left / right hip lower toward the floor, past the starting position. Your foot should get closer to the floor. Do not lean or bend your knees. Repeat __________ times. Complete this exercise __________ times a day. Single leg stand This exercise increases  your balance. Without shoes, stand near a railing or in a doorway. You may hold on to the railing or door frame as needed for balance. Squeeze your left / right buttock muscles, then lift up your other foot. Do not let your left / right hip push out to the side. It is helpful to stand in front of a mirror for this exercise so you can watch your hip. Hold this position for __________ seconds. Repeat __________ times. Complete this exercise __________ times a day. This information is not intended to replace advice given to you by your health care provider. Make sure you discuss any questions you have with your health care provider.  Iliotibial Band Syndrome Rehab Ask your health care provider which exercises are safe for you. Do exercises exactly as told by your provider and adjust them as told. It's normal to feel mild stretching, pulling, tightness, or discomfort as you do these exercises. Stop right away if you feel sudden pain or your pain gets a lot worse. Do not begin these exercises until told by your provider. Stretching and range-of-motion exercises These exercises warm up your muscles and joints. They also improve the movement and flexibility of your hip and pelvis. Quadriceps stretch, prone  Lie face down (prone) on a firm surface like a bed or padded floor. Bend your left / right knee. Reach back to hold your ankle or pant leg. If you can't reach your ankle or pant leg, use a belt looped around your foot and grab the belt instead. Gently pull your heel toward your butt. Your knee should not slide out to the side. You should feel a stretch in the front of your thigh and knee, also called the quadriceps. Hold this position for __________ seconds. Repeat __________ times. Complete this exercise __________ times a day. Iliotibial band stretch The iliotibial band is a strip of tissue that runs along the outside of your hip down to your knee. Lie on your side with your left / right leg on  top. Bend both knees and grab your left / right ankle. Stretch out your bottom arm to help you balance. Slowly bring your top knee back so your thigh goes behind your back. Slowly lower your top leg toward the floor until you feel a gentle stretch on the outside of your left / right hip and thigh. If you don't feel a stretch and your knee won't go farther, place the heel of your other foot on top of your knee and pull your knee down toward the floor with your foot. Hold this position for __________ seconds. Repeat __________ times. Complete this exercise __________ times a day. Strengthening exercises These exercises build strength and endurance in your hip and pelvis. Endurance means your muscles can keep working even when they're tired. Straight leg raises, side-lying This exercise strengthens the muscles that rotate the leg at the hip and move it away from your body. These muscles are called hip abductors. Lie on your side  with your left / right leg on top. Lie so your head, shoulder, hip, and knee line up. You can bend your bottom knee to help you balance. Roll your hips slightly forward so they're stacked directly over each other. Your left / right knee should face forward. Tense the muscles in your outer thigh and hip. Lift your top leg 4-6 inches (10-15 cm) off the ground. Hold this position for __________ seconds. Slowly lower your leg back down to the starting position. Let your muscles fully relax before doing this exercise again. Repeat __________ times. Complete this exercise __________ times a day. Leg raises, prone This exercise strengthens the muscles that move the hips backward. These muscles are called hip extensors. Lie face down (prone) on your bed or a firm surface. You can put a pillow under your hips for comfort and to support your lower back. Bend your left / right knee so your foot points straight up toward the ceiling. Keep the other leg straight and behind you. Squeeze  your butt muscles. Lift your left / right thigh off the firm surface. Do not let your back arch. Tense your thigh muscle as hard as you can without having more knee pain. Hold this position for __________ seconds. Slowly lower your leg to the starting position. Allow your leg to relax all the way. Repeat __________ times. Complete this exercise __________ times a day. Hip hike  Stand sideways on a bottom step. Place your feet so that your left / right leg is on the step, and the other foot is hanging off the side. If you need support for balance, hold onto a railing or wall. Keep your knees straight and your abdomen square, meaning your hips are level. Then, lift your left / right hip up toward the ceiling. Slowly let your leg that's hanging off the step lower towards the floor. Your foot should get closer to the ground. Do not lean or bend your knees during this movement. Repeat __________ times. Complete this exercise __________ times a day.  Standing Labs We placed an order today for your standing lab work.   Please have your standing labs drawn in May and every 3 months   Please have your labs drawn 2 weeks prior to your appointment so that the provider can discuss your lab results at your appointment, if possible.  Please note that you may see your imaging and lab results in MyChart before we have reviewed them. We will contact you once all results are reviewed. Please allow our office up to 72 hours to thoroughly review all of the results before contacting the office for clarification of your results.  WALK-IN LAB HOURS  Monday through Thursday from 8:00 am -12:30 pm and 1:00 pm-5:00 pm and Friday from 8:00 am-12:00 pm.  Patients with office visits requiring labs will be seen before walk-in labs.  You may encounter longer than normal wait times. Please allow additional time. Wait times may be shorter on  Monday and Thursday afternoons.  We do not book appointments for walk-in  labs. We appreciate your patience and understanding with our staff.   Labs are drawn by [facility]. Please bring your co-pay at the time of your lab draw.  You may receive a bill from [facility] for your lab work.  Please note if you are on Hydroxychloroquine and and an order has been placed for a Hydroxychloroquine level,  you will need to have it drawn 4 hours or more after your last dose.  If you wish to have your labs drawn at another location, please call the office 24 hours in advance so we can fax the orders.  The office is located at [address], [place], [place] [address]   If you have any questions regarding directions or hours of operation,  please call [phone].   As a reminder, please drink plenty of water prior to coming for your lab work. Thanks!   Tofacitinib Tablets What is this medication? TOFACITINIB (TOE fa SYE it nib) treats autoimmune conditions, such as arthritis and ulcerative colitis. It is prescribed when other medications have not worked or cannot be tolerated. It works by slowing down an overactive immune system. This decreases inflammation. This medicine may be used for other purposes; ask your health care provider or pharmacist if you have questions. COMMON BRAND NAME(S): Harriette Ohara What should I tell my care team before I take this medication? They need to know if you have any of these conditions: Cancer Current or past tobacco use Diabetes Have had a heart attack or stroke Have had blood clots Have been in close contact with someone who has tuberculosis (TB) Heart disease High blood pressure High cholesterol HIV or AIDS Infection or have had an infection that does not go away, such as tuberculosis (TB), shingles, hepatitis, herpes Kidney disease Live or have traveled to the [facility] [place] or the [place] or [place] Liver disease Low blood cell levels (white cells, red cells, and platelets) Lung or breathing disease, such as  asthma or COPD Recent or upcoming vaccine Stomach or intestine problems Weakened immune system An unusual or allergic reaction to tofacitinib, other medications, foods, dyes, or preservatives Pregnant or trying to get pregnant Breastfeeding How should I use this medication? Take this medication by mouth with water. Take it as directed on the prescription label at the same time every day. You can take it with or without food. If it upsets your stomach, take it with food. Keep taking it unless your care team tells you to stop. A special MedGuide will be given to you by the pharmacist with each prescription and refill. Be sure to read this information carefully each time. Talk to your care team about the use of this medication in children. While this medication may be prescribed for children as young as 2 years for selected conditions, precautions do apply. Overdosage: If you think you have taken too much of this medicine contact a poison control center or emergency room at once. NOTE: This medicine is only for you. Do not share this medicine with others. What if I miss a dose? If you miss a dose, take it as soon as you can. If it is almost time for your next dose, take only that dose. Do not take double or extra doses. What may interact with this medication? Do not take this medication with any of the following: Baricitinib Upadacitinib This medication may also interact with the following: Azathioprine Biologic medications, such as abatacept, adalimumab, anakinra, certolizumab, etanercept, golimumab, infliximab, ofatumumab, rituximab, sarilumab, secukinumab, tocilizumab, ustekinumab, vedolizumab Certain antivirals for HIV or hepatitis Certain medications for fungal infections, such as fluconazole, itraconazole, ketoconazole, voriconazole Certain medications for seizures, such as carbamazepine, phenobarbital, phenytoin Cyclosporine Live virus vaccines Medications that lower your chance of  fighting infection Rifampin Supplements, such as St. John's wort Tacrolimus This list may not describe all possible interactions. Give your health care provider a list of all the medicines, herbs, non-prescription drugs, or dietary supplements you use.  Also tell them if you smoke, drink alcohol, or use illegal drugs. Some items may interact with your medicine. What should I watch for while using this medication? Visit your care team for regular checks on your progress. Tell your care team if your symptoms do not start to get better or if they get worse. You may need blood work done while you are taking this medication. This medication may increase your risk of getting an infection. Call your care team for advice if you get a fever, chills, sore throat, or other symptoms of a cold or flu. Do not treat yourself. Try to avoid being around people who are sick. Your care team will screen you for tuberculosis (TB) before you start this medication. If they think you are at risk, you may be treated with medication for TB. You should start taking the medication for TB before you start this medication. Make sure to finish the full course of TB medication. Talk to your care team about your vaccination history. To lower your risk of infection, you may need certain vaccines before you start this medication. Talk to your care team about your risk of cancer. You may be more at risk for certain types of cancer if you take this medication. Tobacco use may increase your risk of cancer. Talk to your care team about having your skin checked for cancer while taking this medication. Limit the amount of time you spend in the sun. Wear protective clothing and sunscreen when you are in the sun. Do not use sun lamps, tanning beds, or tanning booths. This medication may increase the risk of blood clots, heart attack, stroke, or death. High blood pressure, high cholesterol, diabetes, increased age, excess weight, and tobacco use  increase this risk. Call emergency services right away if you have symptoms of a heart attack or stroke. This medication can increase bad cholesterol and fats (such as LDL, triglycerides) and decrease good cholesterol (HDL) in your blood. You may need blood tests to check your cholesterol. Ask your care team what you can do to lower your risk of high cholesterol while taking this medication. Discuss this medication with your care team if you may be pregnant. There are benefits and risks to taking medications during pregnancy. Your care team can help you find the option that works for you. Do not breastfeed while taking this medication and for 18 hours after the last dose. What side effects may I notice from receiving this medication? Side effects that you should report to your care team as soon as possible: Allergic reactions--skin rash, itching, hives, swelling of the face, lips, tongue, or throat Blood clot--pain, swelling, or warmth in the leg, shortness of breath, chest pain Heart attack--pain or tightness in the chest, shoulders, arms, or jaw, nausea, shortness of breath, cold or clammy skin, feeling faint or lightheaded Infection--fever, chills, cough, sore throat, wounds that don't heal, pain or trouble when passing urine, general feeling of discomfort or being unwell Low red blood cell level--unusual weakness or fatigue, dizziness, headache, trouble breathing Stomach pain that is severe, does not go away, or gets worse Stroke--sudden numbness or weakness of the face, arm, or leg, trouble speaking, confusion, trouble walking, loss of balance or coordination, dizziness, severe headache, change in vision Side effects that usually do not require medical attention (report these to your care team if they continue or are bothersome): Diarrhea Fever Headache Nausea Runny or stuffy nose Sore throat This list may not describe all possible side effects.  Call your doctor for medical advice about side  effects. You may report side effects to FDA at 1-800-FDA-1088. Where should I keep my medication? Keep out of the reach of children and pets. Store at room temperature between 20 and 25 degrees C (68 and 77 degrees F). Get rid of any unused medication after the expiration date. To get rid of medications that are no longer needed or have expired: Take the medication to a medication take-back program. Check with your pharmacy or law enforcement to find a location. If you cannot return the medication, check the label or package insert to see if the medication should be thrown out in the garbage or flushed down the toilet. If you are not sure, ask your care team. If it is safe to put it in the trash, empty the medication out of the container. Mix the medication with cat litter, dirt, coffee grounds, or other unwanted substance. Seal the mixture in a bag or container. Put it in the trash. NOTE: This sheet is a summary. It may not cover all possible information. If you have questions about this medicine, talk to your doctor, pharmacist, or health care provider.  2024 Elsevier/Gold Standard (2023-09-30 00:00:00)

## 2023-12-29 ENCOUNTER — Telehealth: Payer: Self-pay

## 2023-12-29 DIAGNOSIS — C44319 Basal cell carcinoma of skin of other parts of face: Secondary | ICD-10-CM

## 2023-12-29 NOTE — Telephone Encounter (Signed)
-----   Message from Carlisle sent at 12/28/2023 10:50 PM EST ----- Diagnosis: left temple :       BASAL CELL CARCINOMA, SUPERFICIAL AND NODULAR PATTERNS    Please call with diagnosis and determine where the patient would like to have Mohs surgery.  Explanation: your biopsy shows a basal cell skin cancer in the second layer of the skin. This is the most common kind of skin cancer and is caused by damage from sun exposure. Basal cell skin cancers almost never spread beyond the skin, so they are not dangerous to your overall health. However, they will continue to grow, can bleed, cause nonhealing wounds, and disrupt nearby structures unless fully treated.  Treatment: Given the location and type of skin cancer, I recommend Mohs surgery. Mohs surgery involves cutting out the skin cancer and then checking under the microscope to ensure the whole skin cancer was removed. If any skin cancer remains, the surgeon will cut out more until it is fully removed. The cure rate is about 98-99%. Once the Mohs surgeon confirms the skin cancer is out, they will discuss the options to repair or heal the area. You must take it easy for about two weeks after surgery (no lifting over 10-15 lbs, avoid activity to get your heart rate and blood pressure up). It is done at another office outside of [place] ([place], [place], or [place]).

## 2023-12-31 LAB — PROTEIN ELECTROPHORESIS, SERUM, WITH REFLEX
Albumin ELP: 4.2 g/dL (ref 3.8–4.8)
Alpha 1: 0.3 g/dL (ref 0.2–0.3)
Alpha 2: 0.8 g/dL (ref 0.5–0.9)
Beta 2: 0.5 g/dL (ref 0.2–0.5)
Beta Globulin: 0.5 g/dL (ref 0.4–0.6)
Gamma Globulin: 0.9 g/dL (ref 0.8–1.7)
Total Protein: 7.1 g/dL (ref 6.1–8.1)

## 2023-12-31 LAB — CBC WITH DIFFERENTIAL/PLATELET
Absolute Lymphocytes: 2227 {cells}/uL (ref 850–3900)
Absolute Monocytes: 896 {cells}/uL (ref 200–950)
Basophils Absolute: 61 {cells}/uL (ref 0–200)
Basophils Relative: 0.7 %
Eosinophils Absolute: 122 {cells}/uL (ref 15–500)
Eosinophils Relative: 1.4 %
HCT: 37.2 % (ref 35.0–45.0)
Hemoglobin: 12.2 g/dL (ref 11.7–15.5)
MCH: 30 pg (ref 27.0–33.0)
MCHC: 32.8 g/dL (ref 32.0–36.0)
MCV: 91.6 fL (ref 80.0–100.0)
MPV: 12.1 fL (ref 7.5–12.5)
Monocytes Relative: 10.3 %
Neutro Abs: 5394 {cells}/uL (ref 1500–7800)
Neutrophils Relative %: 62 %
Platelets: 320 10*3/uL (ref 140–400)
RBC: 4.06 10*6/uL (ref 3.80–5.10)
RDW: 12.8 % (ref 11.0–15.0)
Total Lymphocyte: 25.6 %
WBC: 8.7 10*3/uL (ref 3.8–10.8)

## 2023-12-31 LAB — QUANTIFERON-TB GOLD PLUS
Mitogen-NIL: 4.98 [IU]/mL
NIL: 0.02 [IU]/mL
QuantiFERON-TB Gold Plus: NEGATIVE
TB1-NIL: 0.01 [IU]/mL
TB2-NIL: 0.01 [IU]/mL

## 2023-12-31 LAB — IGG, IGA, IGM
IgG (Immunoglobin G), Serum: 995 mg/dL (ref 600–1640)
IgM, Serum: 82 mg/dL (ref 50–300)
Immunoglobulin A: 250 mg/dL (ref 47–310)

## 2023-12-31 LAB — RHEUMATOID FACTOR: Rheumatoid fact SerPl-aCnc: 86 [IU]/mL — ABNORMAL HIGH (ref <14)

## 2023-12-31 LAB — COMPLETE METABOLIC PANEL WITH GFR
AG Ratio: 1.6 (calc) (ref 1.0–2.5)
ALT: 42 U/L — ABNORMAL HIGH (ref 6–29)
AST: 25 U/L (ref 10–35)
Albumin: 4.2 g/dL (ref 3.6–5.1)
Alkaline phosphatase (APISO): 74 U/L (ref 37–153)
BUN: 16 mg/dL (ref 7–25)
CO2: 29 mmol/L (ref 20–32)
Calcium: 9.6 mg/dL (ref 8.6–10.4)
Chloride: 104 mmol/L (ref 98–110)
Creat: 0.8 mg/dL (ref 0.50–1.03)
Globulin: 2.7 g/dL (ref 1.9–3.7)
Glucose, Bld: 84 mg/dL (ref 65–99)
Potassium: 4.6 mmol/L (ref 3.5–5.3)
Sodium: 140 mmol/L (ref 135–146)
Total Bilirubin: 0.4 mg/dL (ref 0.2–1.2)
Total Protein: 6.9 g/dL (ref 6.1–8.1)
eGFR: 87 mL/min/{1.73_m2} (ref >=60)

## 2023-12-31 LAB — SEDIMENTATION RATE: Sed Rate: 29 mm/h (ref 0–30)

## 2023-12-31 LAB — CK: Total CK: 96 U/L (ref 21–240)

## 2023-12-31 LAB — CYCLIC CITRUL PEPTIDE ANTIBODY, IGG: Cyclic Citrullin Peptide Ab: >250 U — ABNORMAL HIGH

## 2023-12-31 LAB — C-REACTIVE PROTEIN: CRP: 4.5 mg/L (ref <8.0)

## 2024-01-02 ENCOUNTER — Telehealth: Payer: Self-pay

## 2024-01-02 ENCOUNTER — Encounter: Payer: Self-pay | Admitting: *Deleted

## 2024-01-02 NOTE — Progress Notes (Signed)
 RF and anti-CCP remain positive.   ALT is elevated-42. AST WNL.  Rest of CMP WNL.  Try to limit tylenol and alcohol use.   CBC WNL.  ESR and CRP WNL CK WNL TB gold negative  SPEP normal

## 2024-01-02 NOTE — Telephone Encounter (Signed)
 Pt transferring care to our clinic, currently on Xeljanz XR 11mg . Will ensure there is an active PA to prevent any treatment lapse.  Initiated a Prior Authorization request to [employer] for [employer] via CoverMyMeds, currently awaiting clinical questions to populate.  Key: [id number]

## 2024-01-02 NOTE — Telephone Encounter (Signed)
 We can recheck hepatic function panel in 1 month--avoid tylenol, NSAIDs (advil PRN on med list), and alcohol use.  If remains elevated--can refer the patient to GI.

## 2024-01-03 NOTE — Telephone Encounter (Signed)
 Clinical questions have populated, completed PA request and submitted. Will await response.

## 2024-01-13 NOTE — Progress Notes (Signed)
CXR did not reveal any active cardiopulmonary disease.

## 2024-01-16 ENCOUNTER — Encounter: Payer: Self-pay | Admitting: Dermatology

## 2024-01-17 ENCOUNTER — Encounter: Payer: Self-pay | Admitting: Dermatology

## 2024-01-17 ENCOUNTER — Ambulatory Visit (INDEPENDENT_AMBULATORY_CARE_PROVIDER_SITE_OTHER): Payer: Managed Care, Other (non HMO) | Admitting: Dermatology

## 2024-01-17 VITALS — BP 125/70 | HR 71 | Temp 98.4°F

## 2024-01-17 DIAGNOSIS — L814 Other melanin hyperpigmentation: Secondary | ICD-10-CM | POA: Diagnosis not present

## 2024-01-17 DIAGNOSIS — L579 Skin changes due to chronic exposure to nonionizing radiation, unspecified: Secondary | ICD-10-CM

## 2024-01-17 DIAGNOSIS — C4491 Basal cell carcinoma of skin, unspecified: Secondary | ICD-10-CM

## 2024-01-17 DIAGNOSIS — C44319 Basal cell carcinoma of skin of other parts of face: Secondary | ICD-10-CM | POA: Diagnosis not present

## 2024-01-17 MED ORDER — TRAMADOL HCL 50 MG PO TABS: 50.0000 mg | ORAL_TABLET | Freq: Four times a day (QID) | ORAL | 0 refills | Status: DC | PRN

## 2024-01-17 NOTE — Progress Notes (Signed)
 Follow-Up Visit   Subjective  Carly Stanley is a 53 y.o. female who presents for the following: Mohs of a superficial and nodular BCC on the left temple, referred by Dr. Katrinka Blazing. She is accompanied by a female counterpart named Carly Stanley.   The following portions of the chart were reviewed this encounter and updated as appropriate: medications, allergies, medical history  Review of Systems:  No other skin or systemic complaints except as noted in HPI or Assessment and Plan.  Objective  Well appearing patient in no apparent distress; mood and affect are within normal limits.  A focused examination was performed of the following areas: Left temple Relevant physical exam findings are noted in the Assessment and Plan.     Assessment & Plan   BASAL CELL CARCINOMA (BCC), UNSPECIFIED SITE Left Temple Mohs surgery  Consent obtained: written  Anticoagulation: Was the anticoagulation regimen changed prior to Mohs? No    Procedure Details: Timeout: pre-procedure verification complete Procedure Prep: patient was prepped and draped in usual sterile fashion Prep type: chlorhexidine Pre-Op diagnosis: basal cell carcinoma BCC subtype: nodular and superficial MohsAIQ Surgical site (if tumor spans multiple areas, please select predominant area): temple Surgery side: left Surgical site (from skin exam): Left Temple Pre-operative length (cm): 1 Pre-operative width (cm): 1.1 Indications for Mohs surgery: anatomic location where tissue conservation is critical  Micrographic Surgery Details: Post-operative length (cm): 1.6 Post-operative width (cm): 2 Number of Mohs stages: 2  Skin repair Complexity:  Complex Final length (cm):  4.5 Informed consent: discussed and consent obtained   Timeout: patient name, date of birth, surgical site, and procedure verified   Procedure prep:  Patient was prepped and draped in usual sterile fashion Prep type:  Chlorhexidine Anesthesia: the lesion was  anesthetized in a standard fashion   Anesthetic:  1% lidocaine w/ epinephrine 1-100,000 buffered w/ 8.4% NaHCO3 Reason for type of repair: reduce tension to allow closure, allow side-to-side closure without requiring a flap or graft, compensate for the inelasticity of skin in this area and compensate for the surrounding damaged skin   Undermining: area extensively undermined   Subcutaneous layers (deep stitches):  Suture size:  5-0 Suture type: Monocryl (poliglecaprone 25)   Stitches:  Buried vertical mattress Fine/surface layer approximation (top stitches):  Suture size:  6-0 Suture type: fast-absorbing plain gut   Stitches: simple running   Hemostasis achieved with: pressure, aluminum chloride and electrodesiccation Outcome: patient tolerated procedure well with no complications   Post-procedure details: sterile dressing applied and wound care instructions given   Dressing type: bandage and pressure dressing   Related Medications traMADol (ULTRAM) 50 MG tablet Take 1 tablet (50 mg total) by mouth every 6 (six) hours as needed for up to 8 doses.   Return in about 4 weeks (around 02/14/2024) for wound check.   01/17/2024  HISTORY OF PRESENT ILLNESS  Carly Stanley is seen in consultation at the request of Dr. Katrinka Blazing for biopsy-proven Nodular and Superficial Basal Cell Carcinoma on the left temple. They note that the area has been present for about 2 years increasing in size with time.  There is no history of previous treatment.  Reports no other new or changing lesions and has no other complaints today.  Medications and allergies: see patient chart.  Review of systems: Reviewed 8 systems and notable for the above skin cancer.  All other systems reviewed are unremarkable/negative, unless noted in the HPI. Past medical history, surgical history, family history, social history were also reviewed and  are noted in the chart/questionnaire.    PHYSICAL EXAMINATION  General: Well-appearing, in  no acute distress, alert and oriented x 4. Vitals reviewed in chart (if available).   Skin: Exam reveals a 1.0 x 1.1 cm erythematous papule and biopsy scar on the left temple. There are rhytids, telangiectasias, and lentigines, consistent with photodamage.  Biopsy report(s) reviewed, confirming the diagnosis.   ASSESSMENT  1) Nodular and Superficial Basal Cell Carcinoma on the left temple 2) photodamage 3) solar lentigines   PLAN   1. Due to location, size, histology, or recurrence and the likelihood of subclinical extension as well as the need to conserve normal surrounding tissue, the patient was deemed acceptable for Mohs micrographic surgery (MMS).  The nature and purpose of the procedure, associated benefits and risks including recurrence and scarring, possible complications such as pain, infection, and bleeding, and alternative methods of treatment if appropriate were discussed with the patient during consent. The lesion location was verified by the patient, by reviewing previous notes, pathology reports, and by photographs as well as angulation measurements if available.  Informed consent was reviewed and signed by the patient, and timeout was performed at 8:30 AM. See op note below.  2. For the photodamage and solar lentigines, sun protection discussed/information given on OTC sunscreens, and we recommend continued regular follow-up with primary dermatologist every 6 months or sooner for any growing, bleeding, or changing lesions. 3. Prognosis and future surveillance discussed. 4. Letter with treatment outcome sent to referring provider. 5. Pain acetaminophen/ibuprofen/tramadol 50 mg  MOHS MICROGRAPHIC SURGERY AND RECONSTRUCTION  Initial size:   1.0 x 1.1 cm Surgical defect/wound size: 1.6 x 2.0 cm Anesthesia:    0.33% lidocaine with 1:200,000 epinephrine EBL:    <5 mL Complications:  None Repair type:   Complex SQ suture:   5-0 Monocryl Cutaneous suture:  6-0 Plain gut Final  size of the repair: 4.5 cm  Stages: 2  STAGE I: Anesthesia achieved with 0.5% lidocaine with 1:200,000 epinephrine. ChloraPrep applied. 2 section(s) excised using Mohs technique (this includes total peripheral and deep tissue margin excision and evaluation with frozen sections, excised and interpreted by the same physician). The tumor was first debulked and then excised with an approx. 2 mm margin.  Hemostasis was achieved with electrocautery as needed.  The specimen was then oriented, subdivided/relaxed, inked, and processed using Mohs technique.    Frozen section analysis revealed a positive margin for  multiple, small buds of basaloid cells descending from the epidermis with no dermal invasion in the peripheral margin.    STAGE II: An additional 2 mm margin was excised.  Hemostasis was achieved with electrocautery as needed.  The specimen was then oriented, subdivided/relaxed, inked, and processed using Mohs technique. Evaluation of slides by the Mohs surgeon revealed clear tumor margins.  Reconstruction  The surgical wound was then cleaned, prepped, and re-anesthetized as above. Wound edges were undermined extensively along at least one entire edge and at a distance equal to or greater than the width of the defect (see wound defect size above) in order to achieve closure and decrease wound tension and anatomic distortion. Redundant tissue repair including standing cone removal was performed. Hemostasis was achieved with electrocautery. Subcutaneous and epidermal tissues were approximated with the above sutures. The surgical site was then lightly scrubbed with sterile, saline-soaked gauze. The area was then bandaged using Vaseline ointment, non-adherent gauze, gauze pads, and tape to provide an adequate pressure dressing. The patient tolerated the procedure well, was given detailed written  and verbal wound care instructions, and was discharged in good condition.   The patient will follow-up: 4  weeks.    Documentation: I have reviewed the above documentation for accuracy and completeness, and I agree with the above.  Gwenith Daily, MD

## 2024-01-17 NOTE — Patient Instructions (Signed)

## 2024-01-18 ENCOUNTER — Encounter: Payer: Self-pay | Admitting: Dermatology

## 2024-01-30 NOTE — Telephone Encounter (Signed)
 Received notification from [employer] regarding a prior authorization for [facility]. Authorization has been APPROVED from 01/03/2024 to 01/02/2025.  Patient must continue to fill through [facility]: [phone]  Authorization # [id number]  Chesley Mires, PharmD, MPH, BCPS, CPP Clinical Pharmacist (Rheumatology and Pulmonology)

## 2024-02-13 NOTE — Progress Notes (Deleted)
 Office Visit Note  Patient: Carly Stanley             Date of Birth: 22-Jul-1971           MRN: [id number]             PCP: Felicita Horns, FNP Referring: Felicita Horns, FNP Visit Date: 02/27/2024 Occupation: @GUAROCC @  Subjective:  No chief complaint on file.   History of Present Illness: Carly Stanley is a 53 y.o. female ***   Treatment history: - Prednisone PRN - HCQ 200 mg BID  - Methotrexate started 10/2019, did not tolerate 20 mg PO weekly due to nausea. - Humira started 01/2020. Worked for a short period, but symptoms returned, so discontinued. - Rinvoq started 08/2020 and discontinued as it did not help. - Orencia started 08/2021 and discontinued as it did not help. - Actemra started 12/2021 and discontinued as it did not help. - Tried Orencia again 10/2022 but discontinued as it did not help. [name] started 12/2022  Rheumatoid arthritis involving multiple sites with positive rheumatoid factor (HCC) - RF 87.3 and CCP >250. Dx December 2020-sx started in both hands. Under care of [facility] and [facility]. Previous therapy includes: Prednisone as needed, hydroxychloroquine 200 mg twice daily, methotrexate 20 mg weekly-d/c nausea, Humira-waning effect, Rinvoq-waning effect, Orencia inadequate response, Actemra inadequate response, Xeljanz started in February 2024.  Baseline x-rays of both hands obtained on 10/24/2019 at [facility].  X-rays of both hands updated by [facility] on 07/07/2023-no erosive changes.    Activities of Daily Living:  Patient reports morning stiffness for *** {minute/hour:19697}.   Patient {ACTIONS;DENIES/REPORTS:21021675::"Denies"} nocturnal pain.  Difficulty dressing/grooming: {ACTIONS;DENIES/REPORTS:21021675::"Denies"} Difficulty climbing stairs: {ACTIONS;DENIES/REPORTS:21021675::"Denies"} Difficulty getting out of chair: {ACTIONS;DENIES/REPORTS:21021675::"Denies"} Difficulty using hands for taps, buttons, cutlery, and/or writing:  {ACTIONS;DENIES/REPORTS:21021675::"Denies"}  No Rheumatology ROS completed.   PMFS History:  Patient Active Problem List   Diagnosis Date Noted   Multiple nevi 09/20/2023   Vitamin D deficiency 09/16/2023   Body mass index (BMI) of 33.0 to 33.9 in adult 09/16/2023   Rheumatoid arthritis involving multiple sites with positive rheumatoid factor (HCC) 12/16/2022   Palpitations 11/29/2017   PAC (premature atrial contraction) 11/29/2017   OAB (overactive bladder) 09/28/2017   Mixed incontinence 09/28/2017   Pre-diabetes 2016    Past Medical History:  Diagnosis Date   BCC (basal cell carcinoma of skin) 12/26/2023   left temple, Mohs referral   GERD (gastroesophageal reflux disease)    PAC (premature atrial contraction)    Pre-diabetes 2016   Rheumatoid arteritis (HCC)     Family History  Problem Relation Age of Onset   Breast cancer Mother 61   Hyperlipidemia Mother    Hypertension Mother    Anxiety disorder Mother    Hypertension Father    Stroke Father    Heart disease Father        valve replacement   Osteoarthritis Father    Diabetes Brother    Cancer Maternal Grandmother        Sinus   Heart attack Paternal Grandmother    Hypertension Paternal Grandmother    Atrial fibrillation Paternal Grandmother    Lung cancer Paternal Grandfather 31   Anxiety disorder Son    ADD / ADHD Son    Asthma Son    Rheum arthritis Paternal Great-grandfather    Past Surgical History:  Procedure Laterality Date   CERVICAL DISC SURGERY  2016   CHOLECYSTECTOMY     OVARIAN CYST DRAINAGE     Social History   Social  History Narrative   50 and 55 y/o boys (2024)   Immunization History  Administered Date(s) Administered   Influenza,inj,Quad PF,6+ Mos 10/17/2020   Influenza,inj,quad, With Preservative 08/01/2020   Influenza-Unspecified 10/17/2020   Moderna Sars-Covid-2 Vaccination 10/30/2019, 11/30/2019   Td 10/09/2018   Tdap 07/11/2008     Objective: Vital Signs: LMP 11/10/2021  (Approximate)    Physical Exam   Musculoskeletal Exam: ***  CDAI Exam: CDAI Score: -- Patient Global: --; Provider Global: -- Swollen: --; Tender: -- Joint Exam 02/27/2024   No joint exam has been documented for this visit   There is currently no information documented on the homunculus. Go to the Rheumatology activity and complete the homunculus joint exam.  Investigation: No additional findings.  Imaging: No results found.  Recent Labs: Lab Results  Component Value Date   WBC 8.7 12/28/2023   HGB 12.2 12/28/2023   PLT 320 12/28/2023   NA 140 12/28/2023   K 4.6 12/28/2023   CL 104 12/28/2023   CO2 29 12/28/2023   GLUCOSE 84 12/28/2023   BUN 16 12/28/2023   CREATININE 0.80 12/28/2023   BILITOT 0.4 12/28/2023   ALKPHOS 63 04/29/2023   AST 25 12/28/2023   ALT 42 (H) 12/28/2023   PROT 6.9 12/28/2023   PROT 7.1 12/28/2023   ALBUMIN 4.2 04/29/2023   CALCIUM 9.6 12/28/2023   GFRAA 78 10/17/2020   QFTBGOLDPLUS NEGATIVE 12/28/2023    Speciality Comments: Inadequate response to Plaquenil, Humira, Rinvoq, Orencia, Actemra, methotrexate caused nausea.  Xeljanz 5 mg p.o. twice daily since February 2024  Procedures:  No procedures performed Allergies: Patient has no active allergies.   Assessment / Plan:     Visit Diagnoses: Rheumatoid arthritis involving multiple sites with positive rheumatoid factor (HCC)  High risk medication use  Trochanteric bursitis, left hip  Myofascial pain  PAC (premature atrial contraction)  Palpitations  OAB (overactive bladder)  Mixed incontinence  Multiple nevi  Pre-diabetes  Vitamin D deficiency  History of shingles  Orders: No orders of the defined types were placed in this encounter.  No orders of the defined types were placed in this encounter.   Face-to-face time spent with patient was *** minutes. Greater than 50% of time was spent in counseling and coordination of care.  Follow-Up Instructions: No follow-ups  on file.   [name], PA-C  Note - This record has been created using Dragon software.  Chart creation errors have been sought, but may not always  have been located. Such creation errors do not reflect on  the standard of medical care.

## 2024-02-14 ENCOUNTER — Ambulatory Visit: Admitting: Dermatology

## 2024-02-14 ENCOUNTER — Encounter: Payer: Self-pay | Admitting: Dermatology

## 2024-02-14 VITALS — BP 104/68 | HR 62

## 2024-02-14 DIAGNOSIS — T1490XD Injury, unspecified, subsequent encounter: Secondary | ICD-10-CM

## 2024-02-14 DIAGNOSIS — L905 Scar conditions and fibrosis of skin: Secondary | ICD-10-CM | POA: Diagnosis not present

## 2024-02-14 DIAGNOSIS — Z85828 Personal history of other malignant neoplasm of skin: Secondary | ICD-10-CM

## 2024-02-14 DIAGNOSIS — C44310 Basal cell carcinoma of skin of unspecified parts of face: Secondary | ICD-10-CM

## 2024-02-14 DIAGNOSIS — L539 Erythematous condition, unspecified: Secondary | ICD-10-CM | POA: Diagnosis not present

## 2024-02-14 DIAGNOSIS — L570 Actinic keratosis: Secondary | ICD-10-CM

## 2024-02-14 NOTE — Patient Instructions (Addendum)

## 2024-02-14 NOTE — Progress Notes (Signed)
   Follow Up Visit   Subjective  Carly Stanley is a 53 y.o. female who presents for the following: follow up from Mohs surgery   The patient presents for follow up from Mohs surgery for a BCC on the left temple, treated on 01/17/24, repaired with linear repair. The patient has been bandaging the wound as directed. The endorse the following concerns: none   The following portions of the chart were reviewed this encounter and updated as appropriate: medications, allergies, medical history  Review of Systems:  No other skin or systemic complaints except as noted in HPI or Assessment and Plan.  Objective  Well appearing patient in no apparent distress; mood and affect are within normal limits.  A full examination was performed including scalp, head, and face. All findings within normal limits unless otherwise noted below.  Healing wound with mild erythema  Relevant physical exam findings are noted in the Assessment and Plan.  Left Upper Cutaneous Lip Erythematous thin papules/macules with gritty scale.   Assessment & Plan   Healing s/p Mohs for [facility] ont he left temple, treated on 01/17/24, repaired with linear repair - Reassured that wound is healing well - No evidence of infection - No swelling, induration, purulence, dehiscence, or tenderness out of proportion to the clinical exam, see photo above - Discussed that scars take up to 12 months to mature from the date of surgery - Recommend SPF 30+ to scar daily to prevent purple color from UV exposure during scar maturation process - Discussed that erythema and raised appearance of scar will fade over the next 4-6 months - OK to start scar massage at 4-6 weeks post-op - Can consider silicone based products for scar healing starting at 6 weeks post-op - Ok to continue ointment daily to wound under a bandage for another week  HISTORY OF BASAL CELL CARCINOMA OF THE SKIN - No evidence of recurrence today - Recommend regular full body skin  exams - Recommend daily broad spectrum sunscreen SPF 30+ to sun-exposed areas, reapply every 2 hours as needed.  - Call if any new or changing lesions are noted between office visits  AK (ACTINIC KERATOSIS) Left Upper Cutaneous Lip Destruction of lesion - Left Upper Cutaneous Lip Complexity: simple   Destruction method: cryotherapy   Informed consent: discussed and consent obtained   Timeout:  patient name, date of birth, surgical site, and procedure verified Outcome: patient tolerated procedure well with no complications   Post-procedure details: wound care instructions given   HEALING WOUND   SCAR   BASAL CELL CARCINOMA (BCC) OF SKIN OF FACE, UNSPECIFIED PART OF FACE    Return if symptoms worsen or fail to improve.  I, Wilson Hasten, CMA, am acting as scribe for Deneise Finlay, MD.   Documentation: I have reviewed the above documentation for accuracy and completeness, and I agree with the above.  Deneise Finlay, MD

## 2024-02-27 ENCOUNTER — Ambulatory Visit: Payer: Managed Care, Other (non HMO) | Admitting: Physician Assistant

## 2024-02-27 DIAGNOSIS — N3281 Overactive bladder: Secondary | ICD-10-CM

## 2024-02-27 DIAGNOSIS — M7062 Trochanteric bursitis, left hip: Secondary | ICD-10-CM

## 2024-02-27 DIAGNOSIS — Z79899 Other long term (current) drug therapy: Secondary | ICD-10-CM

## 2024-02-27 DIAGNOSIS — M79641 Pain in right hand: Secondary | ICD-10-CM

## 2024-02-27 DIAGNOSIS — I491 Atrial premature depolarization: Secondary | ICD-10-CM

## 2024-02-27 DIAGNOSIS — D229 Melanocytic nevi, unspecified: Secondary | ICD-10-CM

## 2024-02-27 DIAGNOSIS — M0579 Rheumatoid arthritis with rheumatoid factor of multiple sites without organ or systems involvement: Secondary | ICD-10-CM

## 2024-02-27 DIAGNOSIS — N3946 Mixed incontinence: Secondary | ICD-10-CM

## 2024-02-27 DIAGNOSIS — M7918 Myalgia, other site: Secondary | ICD-10-CM

## 2024-02-27 DIAGNOSIS — Z8619 Personal history of other infectious and parasitic diseases: Secondary | ICD-10-CM

## 2024-02-27 DIAGNOSIS — R002 Palpitations: Secondary | ICD-10-CM

## 2024-02-27 DIAGNOSIS — R7303 Prediabetes: Secondary | ICD-10-CM

## 2024-02-27 DIAGNOSIS — E559 Vitamin D deficiency, unspecified: Secondary | ICD-10-CM

## 2024-02-28 ENCOUNTER — Encounter: Payer: Self-pay | Admitting: Family

## 2024-02-28 ENCOUNTER — Other Ambulatory Visit: Payer: Self-pay | Admitting: Family

## 2024-02-28 ENCOUNTER — Encounter (INDEPENDENT_AMBULATORY_CARE_PROVIDER_SITE_OTHER): Payer: Self-pay

## 2024-02-28 ENCOUNTER — Ambulatory Visit (INDEPENDENT_AMBULATORY_CARE_PROVIDER_SITE_OTHER): Payer: Managed Care, Other (non HMO) | Admitting: Family

## 2024-02-28 VITALS — BP 132/88 | HR 70 | Temp 98.6°F | Ht 62.0 in | Wt 196.6 lb

## 2024-02-28 DIAGNOSIS — R7303 Prediabetes: Secondary | ICD-10-CM

## 2024-02-28 DIAGNOSIS — E559 Vitamin D deficiency, unspecified: Secondary | ICD-10-CM

## 2024-02-28 DIAGNOSIS — Z0000 Encounter for general adult medical examination without abnormal findings: Secondary | ICD-10-CM

## 2024-02-28 DIAGNOSIS — N912 Amenorrhea, unspecified: Secondary | ICD-10-CM | POA: Diagnosis not present

## 2024-02-28 DIAGNOSIS — Z13220 Encounter for screening for lipoid disorders: Secondary | ICD-10-CM | POA: Diagnosis not present

## 2024-02-28 DIAGNOSIS — Z1231 Encounter for screening mammogram for malignant neoplasm of breast: Secondary | ICD-10-CM

## 2024-02-28 DIAGNOSIS — N951 Menopausal and female climacteric states: Secondary | ICD-10-CM | POA: Insufficient documentation

## 2024-02-28 DIAGNOSIS — E66812 Obesity, class 2: Secondary | ICD-10-CM | POA: Insufficient documentation

## 2024-02-28 HISTORY — DX: Encounter for general adult medical examination without abnormal findings: Z00.00

## 2024-02-28 LAB — COMPREHENSIVE METABOLIC PANEL WITH GFR
ALT: 20 U/L (ref 0–35)
AST: 16 U/L (ref 0–37)
Albumin: 4.2 g/dL (ref 3.5–5.2)
Alkaline Phosphatase: 74 U/L (ref 39–117)
BUN: 13 mg/dL (ref 6–23)
CO2: 26 meq/L (ref 19–32)
Calcium: 9.4 mg/dL (ref 8.4–10.5)
Chloride: 106 meq/L (ref 96–112)
Creatinine, Ser: 0.76 mg/dL (ref 0.40–1.20)
GFR: 88.31 mL/min (ref >60.00)
Glucose, Bld: 96 mg/dL (ref 70–99)
Potassium: 4.3 meq/L (ref 3.5–5.1)
Sodium: 139 meq/L (ref 135–145)
Total Bilirubin: 0.4 mg/dL (ref 0.2–1.2)
Total Protein: 6.7 g/dL (ref 6.0–8.3)

## 2024-02-28 LAB — PROLACTIN: Prolactin: 7.2 ng/mL

## 2024-02-28 LAB — VITAMIN D 25 HYDROXY (VIT D DEFICIENCY, FRACTURES): VITD: 26.3 ng/mL — ABNORMAL LOW (ref 30.00–100.00)

## 2024-02-28 LAB — LIPID PANEL
Cholesterol: 168 mg/dL (ref 0–200)
HDL: 64.8 mg/dL (ref >39.00)
LDL Cholesterol: 85 mg/dL (ref 0–99)
NonHDL: 103.38
Total CHOL/HDL Ratio: 3
Triglycerides: 91 mg/dL (ref 0.0–149.0)
VLDL: 18.2 mg/dL (ref 0.0–40.0)

## 2024-02-28 LAB — HEMOGLOBIN A1C: Hgb A1c MFr Bld: 5.9 % (ref 4.6–6.5)

## 2024-02-28 LAB — FOLLICLE STIMULATING HORMONE: FSH: 61.3 m[IU]/mL

## 2024-02-28 LAB — TSH: TSH: 3.07 u[IU]/mL (ref 0.35–5.50)

## 2024-02-28 MED ORDER — CITALOPRAM HYDROBROMIDE 10 MG PO TABS: 10.0000 mg | ORAL_TABLET | Freq: Every day | ORAL | 1 refills | Status: DC

## 2024-02-28 MED ORDER — ESCITALOPRAM OXALATE 10 MG PO TABS: 10.0000 mg | ORAL_TABLET | Freq: Every day | ORAL | 0 refills | Status: DC

## 2024-02-28 MED ORDER — CHOLECALCIFEROL 1.25 MG (50000 UT) PO TABS
1.0000 | ORAL_TABLET | ORAL | 0 refills | Status: DC
Start: 2024-02-28 — End: 2024-03-05

## 2024-02-28 NOTE — Patient Instructions (Addendum)
  Ask about possible coverage for the following:   If not diabetic:  Wegovy   Zepbound Saxenda    ------------------------------------  I have sent an electronic order over to your preferred location for the following:   []   2D Mammogram  [x]   3D Mammogram  []   Bone Density   Please give this center a call to get scheduled at your convenience.  [x]   [facility]  [address] [place] [address]  [phone]  Make sure to wear two piece  clothing  No lotions powders or deodorants the day of the appointment Make sure to bring picture ID and insurance card.  Bring list of medications you are currently taking including any supplements.    ------------------------------------

## 2024-02-28 NOTE — Assessment & Plan Note (Signed)

## 2024-02-28 NOTE — Assessment & Plan Note (Signed)
 Ordered vitamin d pending results.

## 2024-02-28 NOTE — Assessment & Plan Note (Signed)
 Pt advised to work on diet and exercise as tolerated The beneficiary does not have any FDA labeled contraindications to the requested agent including pregnancy, lactation, h/o medullary thyroid cancer or multiple endocrine neoplasia type II.

## 2024-02-28 NOTE — Assessment & Plan Note (Addendum)
 Pt advised of the following: Work on a diabetic diet, try to incorporate exercise at least 20-30 a day for 3 days a week or more.  A1c ordered pending results

## 2024-02-28 NOTE — Progress Notes (Signed)
 Subjective:  Patient ID: Carly Stanley, female    DOB: 10-Oct-1971  Age: 53 y.o. MRN: [id number]  Patient Care Team: Felicita Horns, FNP as PCP - General (Family Medicine) Tyson Gals, MD as Referring Physician (Acute Care) Teresa Fender, MD as Referring Physician (Obstetrics and Gynecology) Anise Kerns, MD as Referring Physician (Gastroenterology) Devorah Fonder, MD as Consulting Physician (Cardiology)   CC:  Chief Complaint  Patient presents with   Annual Exam    HPI Carly Stanley is a 53 y.o. female who presents today for an annual physical exam. She reports consuming a general diet.  Walks throughout the week, nothing strenuous  She generally feels well. She reports sleeping poorly. She does have additional problems to discuss today. Sleep is a problem because of 'everything' with hot flashes and racing thoughts. She on a good night gets 5-6 hours of sleep, she works 24 hours through when she works and then has trouble getting a good sleep schedule.   Vision:Within last year Dental:Receives regular dental care  Pt is with acute concerns.  With hot flashes, feels hot all of hte time going thorough menopausal changes.  Amenorrhea for > 2 years.   Advanced Directives Patient does not have advanced directives   Obesity: she has tried GLP 1 in the past but was too expensive. She is curious what she can do about weight loss.  The beneficiary does not have any FDA labeled contraindications to the requested agent including pregnancy, lactation, h/o medullary thyroid cancer or multiple endocrine neoplasia type II.       DEPRESSION SCREENING    02/11/2023   11:05 AM 01/12/2023    1:35 PM 12/15/2022    4:04 PM 08/11/2020   12:14 PM 10/12/2019    2:11 PM 10/09/2018   10:05 AM 05/16/2018    3:11 PM  PHQ 2/9 Scores  PHQ - 2 Score 0 0 0 0 0 0 0  PHQ- 9 Score 5  5   0      ROS: Negative unless specifically indicated above in HPI.    Current Outpatient Medications:     citalopram (CELEXA) 10 MG tablet, Take 1 tablet (10 mg total) by mouth daily., Disp: 30 tablet, Rfl: 1   cetirizine (ZYRTEC) 10 MG tablet, Take 10 mg by mouth daily., Disp: , Rfl:    ibuprofen  (ADVIL ) 200 MG tablet, Take by mouth as needed., Disp: , Rfl:    nebivolol  (BYSTOLIC ) 10 MG tablet, Take 10 mg twice a day, may take extra 10 mg as needed for breakthrough palpitations, Disp: 180 tablet, Rfl: 3   traMADol  (ULTRAM ) 50 MG tablet, Take 1 tablet (50 mg total) by mouth every 6 (six) hours as needed for up to 8 doses., Disp: 8 tablet, Rfl: 0   XELJANZ XR 11 MG TB24, Take 1 tablet by mouth daily., Disp: , Rfl:     Objective:    BP 132/88 (BP Location: Left Arm, Patient Position: Sitting, Cuff Size: Large)   Pulse 70   Temp 98.6 F (37 C) (Temporal)   Ht 5\' 2"  (1.575 m)   Wt 196 lb 9.6 oz (89.2 kg)   LMP 11/10/2021 (Approximate)   SpO2 98%   BMI 35.96 kg/m   BP Readings from Last 3 Encounters:  02/28/24 132/88  02/14/24 104/68  01/17/24 125/70      Physical Exam Constitutional:      General: She is not in acute distress.    Appearance: Normal appearance. She is obese. She  is not ill-appearing.  HENT:     Head: Normocephalic.     Right Ear: Tympanic membrane normal.     Left Ear: Tympanic membrane normal.     Nose: Nose normal.     Mouth/Throat:     Mouth: Mucous membranes are moist.  Eyes:     Extraocular Movements: Extraocular movements intact.     Pupils: Pupils are equal, round, and reactive to light.  Cardiovascular:     Rate and Rhythm: Normal rate and regular rhythm.  Pulmonary:     Effort: Pulmonary effort is normal.     Breath sounds: Normal breath sounds.  Abdominal:     General: Abdomen is flat. Bowel sounds are normal.     Palpations: Abdomen is soft.     Tenderness: There is no guarding or rebound.  Musculoskeletal:        General: Normal range of motion.     Cervical back: Normal range of motion.  Skin:    General: Skin is warm.     Capillary  Refill: Capillary refill takes less than 2 seconds.  Neurological:     General: No focal deficit present.     Mental Status: She is alert.  Psychiatric:        Mood and Affect: Mood normal.        Behavior: Behavior normal.        Thought Content: Thought content normal.        Judgment: Judgment normal.          Assessment & Plan:  Screening mammogram for breast cancer -     3D Screening Mammogram, Left and Right; Future  Encounter for general adult medical examination without abnormal findings Assessment & Plan: Patient Counseling(The following topics were reviewed):  Preventative care handout given to pt  Health maintenance and immunizations reviewed. Please refer to Health maintenance section. Pt advised on safe sex, wearing seatbelts in car, and proper nutrition labwork ordered today for annual Dental health: Discussed importance of regular tooth brushing, flossing, and dental visits.   Orders: -     3D Screening Mammogram, Left and Right; Future -     Comprehensive metabolic panel with GFR -     Lipid panel -     VITAMIN D  25 Hydroxy (Vit-D Deficiency, Fractures) -     TSH -     Hemoglobin A1c  Vitamin D  deficiency Assessment & Plan: Ordered vitamin d  pending results.     Pre-diabetes Assessment & Plan: Pt advised of the following: Work on a diabetic diet, try to incorporate exercise at least 20-30 a day for 3 days a week or more. A1c ordered pending results.    Orders: -     Hemoglobin A1c -     Amb Ref to Medical Weight Management  Screening for lipoid disorders -     Lipid panel  Vasomotor symptoms due to menopause -     Citalopram Hydrobromide; Take 1 tablet (10 mg total) by mouth daily.  Dispense: 30 tablet; Refill: 1  Amenorrhea -     Follicle stimulating hormone -     Prolactin -     TSH  Obesity, Class II, BMI 35-39.9 Assessment & Plan: Pt advised to work on diet and exercise as tolerated The beneficiary does not have any FDA labeled  contraindications to the requested agent including pregnancy, lactation, h/o medullary thyroid cancer or multiple endocrine neoplasia type II.     Orders: -     Amb Ref  to Medical Weight Management      Follow-up: Return in about 1 year (around 02/27/2025) for f/u CPE.   Felicita Horns, FNP

## 2024-02-29 ENCOUNTER — Encounter: Payer: Self-pay | Admitting: *Deleted

## 2024-02-29 ENCOUNTER — Encounter: Payer: Self-pay | Admitting: Family

## 2024-03-02 ENCOUNTER — Encounter: Payer: Self-pay | Admitting: Family

## 2024-03-02 DIAGNOSIS — E559 Vitamin D deficiency, unspecified: Secondary | ICD-10-CM

## 2024-03-05 MED ORDER — CHOLECALCIFEROL 1.25 MG (50000 UT) PO TABS: 1.0000 | ORAL_TABLET | ORAL | 0 refills | Status: DC

## 2024-03-08 NOTE — Progress Notes (Unsigned)
 Office Visit Note  Patient: Carly Stanley             Date of Birth: 10-May-1971           MRN: [id number]             PCP: Carly Horns, FNP Referring: Carly Horns, FNP Visit Date: 03/15/2024 Occupation: @GUAROCC @  Subjective:  Muscle cramping   History of Present Illness: Carly Stanley is a 53 y.o. female with history of rheumatoid arthritis.  She is taking xeljanz 11 mg XR by mouth daily.  She is tolerating Carly Stanley without any side effects and has not had any recent gaps in therapy.  She experiences occasional stiffness and discomfort in her hands typically after overuse or repetitive activities.  Overall her symptoms have been well-controlled on the current treatment regimen.  She has noticed some increased muscle cramping in her legs as well as restless legs at night and is concerned about a possible nutritional deficiency.  Patient states that she was recently found to be vitamin D  deficient and has been taking a vitamin D  supplement as recommended by her PCP. Patient recently had a basal cell carcinoma removed from her forehead.  She will be following up with dermatology on a yearly basis.  She denies any other new medical conditions.  She denies any recent or recurrent infections.    Activities of Daily Living:  Patient reports morning stiffness for 5-10 minutes.   Patient Reports nocturnal pain.  Difficulty dressing/grooming: Denies Difficulty climbing stairs: Reports Difficulty getting out of chair: Denies Difficulty using hands for taps, buttons, cutlery, and/or writing: Reports  Review of Systems  Constitutional:  Positive for fatigue.  HENT:  Negative for mouth sores and mouth dryness.   Eyes:  Negative for dryness.  Respiratory:  Negative for shortness of breath.   Cardiovascular:  Positive for palpitations. Negative for chest pain.  Gastrointestinal:  Negative for blood in stool, constipation and diarrhea.  Endocrine: Positive for increased urination.   Genitourinary:  Negative for involuntary urination.  Musculoskeletal:  Positive for joint pain, joint pain, joint swelling, myalgias, muscle weakness, morning stiffness, muscle tenderness and myalgias. Negative for gait problem.  Skin:  Positive for sensitivity to sunlight. Negative for color change, rash and hair loss.  Allergic/Immunologic: Negative for susceptible to infections.  Neurological:  Negative for dizziness and headaches.  Hematological:  Negative for swollen glands.  Psychiatric/Behavioral:  Positive for sleep disturbance. Negative for depressed mood. The patient is nervous/anxious.     PMFS History:  Patient Active Problem List   Diagnosis Date Noted   Obesity, Class II, BMI 35-39.9 02/28/2024   Screening for lipoid disorders 02/28/2024   Vasomotor symptoms due to menopause 02/28/2024   Vitamin D  deficiency 09/16/2023   Body mass index (BMI) of 33.0 to 33.9 in adult 09/16/2023   Rheumatoid arthritis involving multiple sites with positive rheumatoid factor (HCC) 12/16/2022   Palpitations 11/29/2017   PAC (premature atrial contraction) 11/29/2017   OAB (overactive bladder) 09/28/2017   Mixed incontinence 09/28/2017   Pre-diabetes 2016    Past Medical History:  Diagnosis Date   BCC (basal cell carcinoma of skin) 12/26/2023   left temple, Mohs referral   Encounter for general adult medical examination without abnormal findings 02/28/2024   GERD (gastroesophageal reflux disease)    PAC (premature atrial contraction)    Pre-diabetes 2016   Rheumatoid arteritis (HCC)     Family History  Problem Relation Age of Onset   Breast cancer Mother 27  Hyperlipidemia Mother    Hypertension Mother    Anxiety disorder Mother    Hypertension Father    Stroke Father    Heart disease Father        valve replacement   Osteoarthritis Father    Diabetes Brother    Cancer Maternal Grandmother        Sinus   Heart attack Paternal Grandmother    Hypertension Paternal Grandmother     Atrial fibrillation Paternal Grandmother    Lung cancer Paternal Grandfather 43   Anxiety disorder Son    ADD / ADHD Son    Asthma Son    Rheum arthritis Paternal Great-grandfather    Past Surgical History:  Procedure Laterality Date   BASAL CELL CARCINOMA EXCISION     CERVICAL DISC SURGERY  2016   CHOLECYSTECTOMY     OVARIAN CYST DRAINAGE     Social History   Social History Narrative   27 and 55 y/o boys (2024)   Immunization History  Administered Date(s) Administered   Influenza,inj,Quad PF,6+ Mos 10/17/2020   Influenza,inj,quad, With Preservative 08/01/2020   Influenza-Unspecified 10/17/2020   Moderna Sars-Covid-2 Vaccination 10/30/2019, 11/30/2019   Td 10/09/2018   Tdap 07/11/2008     Objective: Vital Signs: BP 106/67 (BP Location: Left Arm, Patient Position: Sitting, Cuff Size: Normal)   Pulse 66   Resp 14   Ht 5\' 2"  (1.575 m)   Wt 201 lb (91.2 kg)   LMP 11/10/2021 (Approximate)   BMI 36.76 kg/m    Physical Exam Vitals and nursing note reviewed.  Constitutional:      Appearance: She is well-developed.  HENT:     Head: Normocephalic and atraumatic.  Eyes:     Conjunctiva/sclera: Conjunctivae normal.  Cardiovascular:     Rate and Rhythm: Normal rate and regular rhythm.     Heart sounds: Normal heart sounds.  Pulmonary:     Effort: Pulmonary effort is normal.     Breath sounds: Normal breath sounds.  Abdominal:     General: Bowel sounds are normal.     Palpations: Abdomen is soft.  Musculoskeletal:     Cervical back: Normal range of motion.  Lymphadenopathy:     Cervical: No cervical adenopathy.  Skin:    General: Skin is warm and dry.     Capillary Refill: Capillary refill takes less than 2 seconds.  Neurological:     Mental Status: She is alert and oriented to person, place, and time.  Psychiatric:        Behavior: Behavior normal.      Musculoskeletal Exam: C-spine, thoracic spine, lumbar spine and good range of motion.  Shoulder joints,  elbow joints, wrist joints of MCPs, PIPs, DIPs have good range of motion with no synovitis.  Complete fist formation bilaterally.  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  Hip joints have good range of motion with no groin pain.  Mild tenderness over the trochanteric bursa bilaterally.  Knee joints have good range of motion with no warmth or effusion.  Ankle joints have good range of motion with no tenderness or joint swelling.  CDAI Exam: CDAI Score: -- Patient Global: --; Provider Global: -- Swollen: --; Tender: -- Joint Exam 03/15/2024   No joint exam has been documented for this visit   There is currently no information documented on the homunculus. Go to the Rheumatology activity and complete the homunculus joint exam.  Investigation: No additional findings.  Imaging: No results found.  Recent Labs:  Lab Results  Component Value Date   WBC 8.7 12/28/2023   HGB 12.2 12/28/2023   PLT 320 12/28/2023   NA 139 02/28/2024   K 4.3 02/28/2024   CL 106 02/28/2024   CO2 26 02/28/2024   GLUCOSE 96 02/28/2024   BUN 13 02/28/2024   CREATININE 0.76 02/28/2024   BILITOT 0.4 02/28/2024   ALKPHOS 74 02/28/2024   AST 16 02/28/2024   ALT 20 02/28/2024   PROT 6.7 02/28/2024   ALBUMIN 4.2 02/28/2024   CALCIUM 9.4 02/28/2024   GFRAA 78 10/17/2020   QFTBGOLDPLUS NEGATIVE 12/28/2023    Speciality Comments: Inadequate response to Plaquenil, Humira, Rinvoq, Orencia, Actemra, methotrexate caused nausea.  Xeljanz 5 mg p.o. twice daily since February 2024  Procedures:  No procedures performed Allergies: Patient has no known allergies.         Assessment / Plan:     Visit Diagnoses: Rheumatoid arthritis involving multiple sites with positive rheumatoid factor (HCC) - RF 87.3 and CCP >250. Dx December 2020-sx started in both hands. Under care of [facility] and [facility]. Previous therapy includes: Prednisone as needed, hydroxychloroquine 200 mg twice daily, methotrexate  20 mg weekly-d/c nausea, Humira-waning effect, Rinvoq-waning effect, Orencia inadequate response, Actemra inadequate response, Xeljanz started in February 2024.  Baseline x-rays of both hands obtained on 10/24/2019 at [facility].  X-rays of both hands updated by [facility] on 07/07/2023-no erosive changes.  She has no synovitis on examination today.  Overall her rheumatoid arthritis remains well-controlled taking Xeljanz 11 mg 1 tablet by mouth daily.  She is tolerating Carly Stanley without any side effects and has not had any recent gaps in therapy.  No medication changes will be made at this time.  She was advised to notify us  if she develops signs or symptoms of a flare.  She will follow-up in the office in 5 months or sooner if needed.  High risk medication use - Xeljanz 11 mg XR 1 tablet by mouth daily Treatment history: - Prednisone PRN - HCQ 200 mg BID  - Methotrexate started 10/2019, did not tolerate 20 mg PO weekly due to nausea. - Humira started 01/2020. Worked for a short period, but symptoms returned, so discontinued. - Rinvoq started 08/2020 and discontinued as it did not help. - Orencia started 08/2021 and discontinued as it did not help. - Actemra started 12/2021 and discontinued as it did not help. - Tried Orencia again 10/2022 but discontinued as it did not help. Carly Stanley started 12/2022 CMP updated on 02/28/24.  CBC updated on 12/28/23.  Order for CBC with differential updated today. Her next lab work will be due in early August and every 3 months to monitor for drug toxicity. Lipid panel WNL 02/28/24  TB gold negative on 12/28/23.  No recent or recurrent infections.  Discussed the importance of holding xeljanz if she develops signs or symptoms of an infection and to resume once the infection has completely cleared.  Patient plans on continuing to follow-up with dermatology on a yearly basis.  Discussed the increased risk for developing skin cancer in patients on Jak inhibitors.  Pain in both hands -  X-rays of both hands updated on 07/07/23: mild osteoarthrosis at scattered interphalangeal joints with narrowing, sclerosis, osteophytosis and small ossicles.  Patient experiences intermittent pain and stiffness involving both hands particularly after repetitive or overuse activities.  She has no synovitis on examination today.  Discussed the importance of joint protection and muscle strengthening.  Myofascial pain: Patient experiences intermittent myalgias and muscle tenderness  due to myofascial pain.   Trochanteric bursitis, left hip: Patient has intermittent discomfort on the lateral aspect of both hips consistent with struct of bursitis.  She is not currently symptomatic.  Muscle cramping - She has been experiencing increased muscle cramping in bilateral lower extremities.  Plan to obtain the following lab work today for further evaluation.  Plan: CBC with Differential/Platelet, Magnesium, CK  Restless legs -She has been experiencing symptoms of restless legs at night.  Plan to obtain the following lab work today for further evaluation.  Plan: CBC with Differential/Platelet, Magnesium, CK  Other medical conditions are listed as follows:  PAC (premature atrial contraction) - Holter monitor-PACs and PVCs--Taking bystolic -well-controlled.  Palpitations  OAB (overactive bladder)  Mixed incontinence  Multiple nevi  Pre-diabetes  Vitamin D  deficiency: She is taking vitamin D  50,000 units once weekly.  History of shingles     Orders: Orders Placed This Encounter  Procedures   CBC with Differential/Platelet   Magnesium   CK   No orders of the defined types were placed in this encounter.   Follow-Up Instructions: Return in about 5 months (around 08/15/2024) for Rheumatoid arthritis.   [name], PA-C  Note - This record has been created using Dragon software.  Chart creation errors have been sought, but may not always  have been located. Such creation errors do not  reflect on  the standard of medical care.

## 2024-03-15 ENCOUNTER — Encounter: Payer: Self-pay | Admitting: Physician Assistant

## 2024-03-15 ENCOUNTER — Ambulatory Visit: Attending: Physician Assistant | Admitting: Physician Assistant

## 2024-03-15 VITALS — BP 106/67 | HR 66 | Resp 14 | Ht 62.0 in | Wt 201.0 lb

## 2024-03-15 DIAGNOSIS — M79642 Pain in left hand: Secondary | ICD-10-CM

## 2024-03-15 DIAGNOSIS — D229 Melanocytic nevi, unspecified: Secondary | ICD-10-CM

## 2024-03-15 DIAGNOSIS — R252 Cramp and spasm: Secondary | ICD-10-CM

## 2024-03-15 DIAGNOSIS — N3946 Mixed incontinence: Secondary | ICD-10-CM

## 2024-03-15 DIAGNOSIS — E559 Vitamin D deficiency, unspecified: Secondary | ICD-10-CM

## 2024-03-15 DIAGNOSIS — M7062 Trochanteric bursitis, left hip: Secondary | ICD-10-CM | POA: Diagnosis not present

## 2024-03-15 DIAGNOSIS — M0579 Rheumatoid arthritis with rheumatoid factor of multiple sites without organ or systems involvement: Secondary | ICD-10-CM

## 2024-03-15 DIAGNOSIS — G2581 Restless legs syndrome: Secondary | ICD-10-CM

## 2024-03-15 DIAGNOSIS — M79641 Pain in right hand: Secondary | ICD-10-CM

## 2024-03-15 DIAGNOSIS — N3281 Overactive bladder: Secondary | ICD-10-CM

## 2024-03-15 DIAGNOSIS — M7918 Myalgia, other site: Secondary | ICD-10-CM

## 2024-03-15 DIAGNOSIS — R002 Palpitations: Secondary | ICD-10-CM

## 2024-03-15 DIAGNOSIS — Z79899 Other long term (current) drug therapy: Secondary | ICD-10-CM

## 2024-03-15 DIAGNOSIS — I491 Atrial premature depolarization: Secondary | ICD-10-CM

## 2024-03-15 DIAGNOSIS — R7303 Prediabetes: Secondary | ICD-10-CM

## 2024-03-15 DIAGNOSIS — Z8619 Personal history of other infectious and parasitic diseases: Secondary | ICD-10-CM

## 2024-03-15 NOTE — Patient Instructions (Signed)
 Standing Labs We placed an order today for your standing lab work.   Please have your standing labs drawn in August and every 3 months   Please have your labs drawn 2 weeks prior to your appointment so that the provider can discuss your lab results at your appointment, if possible.  Please note that you may see your imaging and lab results in MyChart before we have reviewed them. We will contact you once all results are reviewed. Please allow our office up to 72 hours to thoroughly review all of the results before contacting the office for clarification of your results.  WALK-IN LAB HOURS  Monday through Thursday from 8:00 am -12:30 pm and 1:00 pm-4:00 pm and Friday from 8:00 am-12:00 pm.  Patients with office visits requiring labs will be seen before walk-in labs.  You may encounter longer than normal wait times. Please allow additional time. Wait times may be shorter on  Monday and Thursday afternoons.  We do not book appointments for walk-in labs. We appreciate your patience and understanding with our staff.   Labs are drawn by [facility]. Please bring your co-pay at the time of your lab draw.  You may receive a bill from [facility] for your lab work.  Please note if you are on Hydroxychloroquine and and an order has been placed for a Hydroxychloroquine level,  you will need to have it drawn 4 hours or more after your last dose.  If you wish to have your labs drawn at another location, please call the office 24 hours in advance so we can fax the orders.  The office is located at [address], [place], [place] [address]   If you have any questions regarding directions or hours of operation,  please call [phone].   As a reminder, please drink plenty of water prior to coming for your lab work. Thanks!

## 2024-03-16 ENCOUNTER — Other Ambulatory Visit: Payer: Self-pay

## 2024-03-16 ENCOUNTER — Ambulatory Visit: Payer: Self-pay | Admitting: Physician Assistant

## 2024-03-16 LAB — CBC WITH DIFFERENTIAL/PLATELET
Absolute Lymphocytes: 1919 {cells}/uL (ref 850–3900)
Absolute Monocytes: 1000 {cells}/uL — ABNORMAL HIGH (ref 200–950)
Basophils Absolute: 49 {cells}/uL (ref 0–200)
Basophils Relative: 0.6 %
Eosinophils Absolute: 131 {cells}/uL (ref 15–500)
Eosinophils Relative: 1.6 %
HCT: 37.6 % (ref 35.0–45.0)
Hemoglobin: 12.2 g/dL (ref 11.7–15.5)
MCH: 30 pg (ref 27.0–33.0)
MCHC: 32.4 g/dL (ref 32.0–36.0)
MCV: 92.6 fL (ref 80.0–100.0)
MPV: 12.3 fL (ref 7.5–12.5)
Monocytes Relative: 12.2 %
Neutro Abs: 5100 {cells}/uL (ref 1500–7800)
Neutrophils Relative %: 62.2 %
Platelets: 298 10*3/uL (ref 140–400)
RBC: 4.06 10*6/uL (ref 3.80–5.10)
RDW: 12.4 % (ref 11.0–15.0)
Total Lymphocyte: 23.4 %
WBC: 8.2 10*3/uL (ref 3.8–10.8)

## 2024-03-16 LAB — CK: Total CK: 102 U/L (ref 21–240)

## 2024-03-16 LAB — MAGNESIUM: Magnesium: 2.1 mg/dL (ref 1.5–2.5)

## 2024-03-16 MED ORDER — XELJANZ XR 11 MG PO TB24: 1.0000 | ORAL_TABLET | Freq: Every day | ORAL | 0 refills | Status: DC

## 2024-03-16 NOTE — Progress Notes (Signed)
 CK WNL Magnesium WNL Absolute monocytes are slightly elevated. Rest of CBC WNL

## 2024-03-16 NOTE — Telephone Encounter (Signed)
 Taking over xeljanz rx per your note from 03/15/2024.   Patient was consented in the office on 12/28/2023.   Pharmacy team completed PA and patient must fill through [facility].   Please review and send rx to the pharmacy. Thanks!

## 2024-03-22 ENCOUNTER — Ambulatory Visit
Admission: RE | Admit: 2024-03-22 | Discharge: 2024-03-22 | Disposition: A | Discharge status: Home / Self Care | Source: Ambulatory Visit | Attending: Family | Admitting: Family

## 2024-03-22 DIAGNOSIS — Z1231 Encounter for screening mammogram for malignant neoplasm of breast: Secondary | ICD-10-CM | POA: Insufficient documentation

## 2024-03-22 DIAGNOSIS — Z0000 Encounter for general adult medical examination without abnormal findings: Secondary | ICD-10-CM | POA: Insufficient documentation

## 2024-03-28 ENCOUNTER — Encounter (INDEPENDENT_AMBULATORY_CARE_PROVIDER_SITE_OTHER): Payer: Self-pay | Admitting: Adult Health

## 2024-03-28 ENCOUNTER — Ambulatory Visit (INDEPENDENT_AMBULATORY_CARE_PROVIDER_SITE_OTHER): Admitting: Adult Health

## 2024-03-28 VITALS — BP 108/72 | HR 66 | Temp 97.9°F | Ht 62.0 in | Wt 198.0 lb

## 2024-03-28 DIAGNOSIS — Z6836 Body mass index (BMI) 36.0-36.9, adult: Secondary | ICD-10-CM

## 2024-03-28 DIAGNOSIS — N951 Menopausal and female climacteric states: Secondary | ICD-10-CM

## 2024-03-28 DIAGNOSIS — E66812 Obesity, class 2: Secondary | ICD-10-CM | POA: Diagnosis not present

## 2024-03-28 DIAGNOSIS — R7303 Prediabetes: Secondary | ICD-10-CM | POA: Diagnosis not present

## 2024-03-28 DIAGNOSIS — Z0289 Encounter for other administrative examinations: Secondary | ICD-10-CM

## 2024-03-28 DIAGNOSIS — E559 Vitamin D deficiency, unspecified: Secondary | ICD-10-CM | POA: Diagnosis not present

## 2024-03-28 NOTE — Progress Notes (Signed)
 Office: [phone]  /  Fax: [phone]   Initial Visit    Carly Stanley was seen in clinic today to evaluate for obesity. She is interested in losing weight to improve overall health and reduce the risk of weight related complications. She presents today to review program treatment options, initial physical assessment, and evaluation.      She was referred by: Self-Referral  When asked what else they would like to accomplish? She states: Adopt healthier eating patterns, Improve energy levels and physical activity, Improve existing medical conditions, Reduce number of medications, Improve quality of life, and Current Weight 198 lbs, Goal Weight 145-150 lbs  When asked how has your weight affected you? She states: Contributed to medical problems, Contributed to orthopedic problems or mobility issues, Having fatigue, Having poor endurance, and Problems with eating patterns  Weight history: Weight gain since RA dx in 2020/2021  Highest weight: 206 lbs  Some associated conditions: Vitamin D  Deficiency  Contributing factors: disruption of circadian rhythm / sleep disordered breathing, consumption of processed foods, use of obesogenic medications: Beta-blockers and Psychotropic medications, moderate to high levels of stress, reduced physical acitivity, eating patterns, and menopause  Weight promoting medications identified: Beta-blockers and Psychotropic medications  Prior weight loss attempts: Tracking and Journaling, Meal Replacements, and Balanced Plate / Portion Control  Current nutrition plan: None  Current level of physical activity: None  Current or previous pharmacotherapy: GLP-1  Response to medication: Lost weight initially but was unable to sustain weight loss   Past medical history includes:   Past Medical History:  Diagnosis Date   BCC (basal cell carcinoma of skin) 12/26/2023   left temple, Mohs referral   Encounter for general adult medical examination without  abnormal findings 02/28/2024   GERD (gastroesophageal reflux disease)    PAC (premature atrial contraction)    Pre-diabetes 2016   Rheumatoid arteritis (HCC)      Objective    BP 108/72   Pulse 66   Temp 97.9 F (36.6 C)   Ht 5\' 2"  (1.575 m)   Wt 198 lb (89.8 kg)   LMP 11/10/2021 (Approximate)   SpO2 99%   BMI 36.21 kg/m  She was weighed on the bioimpedance scale: Body mass index is 36.21 kg/m.  Body Fat%:42.9, Visceral Fat Rating:12, Weight trend over the last 12 months: Increasing  General:  Alert, oriented and cooperative. Patient is in no acute distress.  Respiratory: Normal respiratory effort, no problems with respiration noted   Gait: able to ambulate independently  Mental Status: Normal mood and affect. Normal behavior. Normal judgment and thought content.   DIAGNOSTIC DATA REVIEWED:  BMET    Component Value Date/Time   NA 139 02/28/2024 0923   NA 142 04/29/2023 0845   K 4.3 02/28/2024 0923   CL 106 02/28/2024 0923   CO2 26 02/28/2024 0923   GLUCOSE 96 02/28/2024 0923   BUN 13 02/28/2024 0923   BUN 15 04/29/2023 0845   CREATININE 0.76 02/28/2024 0923   CREATININE 0.80 12/28/2023 1152   CALCIUM 9.4 02/28/2024 0923   GFRNONAA 68 10/17/2020 1020   GFRAA 78 10/17/2020 1020   Lab Results  Component Value Date   HGBA1C 5.9 02/28/2024   HGBA1C 5.8 (H) 09/11/2015   No results found for: "INSULIN" CBC    Component Value Date/Time   WBC 8.2 03/15/2024 1600   RBC 4.06 03/15/2024 1600   HGB 12.2 03/15/2024 1600   HGB 12.2 04/29/2023 0845   HCT 37.6 03/15/2024 1600   HCT  37.3 04/29/2023 0845   PLT 298 03/15/2024 1600   PLT 308 04/29/2023 0845   MCV 92.6 03/15/2024 1600   MCV 92 04/29/2023 0845   MCH 30.0 03/15/2024 1600   MCHC 32.4 03/15/2024 1600   RDW 12.4 03/15/2024 1600   RDW 12.1 04/29/2023 0845   Iron/TIBC/Ferritin/ %Sat No results found for: "IRON", "TIBC", "FERRITIN", "IRONPCTSAT" Lipid Panel     Component Value Date/Time   CHOL 168  02/28/2024 0923   CHOL 173 04/29/2023 0845   TRIG 91.0 02/28/2024 0923   HDL 64.80 02/28/2024 0923   HDL 68 04/29/2023 0845   CHOLHDL 3 02/28/2024 0923   VLDL 18.2 02/28/2024 0923   LDLCALC 85 02/28/2024 0923   LDLCALC 91 04/29/2023 0845   Hepatic Function Panel     Component Value Date/Time   PROT 6.7 02/28/2024 0923   PROT 6.4 04/29/2023 0845   ALBUMIN 4.2 02/28/2024 0923   ALBUMIN 4.2 04/29/2023 0845   AST 16 02/28/2024 0923   ALT 20 02/28/2024 0923   ALKPHOS 74 02/28/2024 0923   BILITOT 0.4 02/28/2024 0923   BILITOT 0.3 04/29/2023 0845      Component Value Date/Time   TSH 3.07 02/28/2024 0923     Assessment and Plan   Pre-diabetes  Vitamin D  deficiency  Vasomotor symptoms due to menopause  Obesity, Class II, BMI 35-39.9, STARTING BMI 36.2    Assessment and Plan                 Obesity Treatment / Action Plan:  Patient will work on garnering support from family and friends to begin weight loss journey. Will work on eliminating or reducing the presence of highly palatable, calorie dense foods in the home. Will complete provided nutritional and psychosocial assessment questionnaire before the next appointment. Will be scheduled for indirect calorimetry to determine resting energy expenditure in a fasting state.  This will allow us  to create a reduced calorie, high-protein meal plan to promote loss of fat mass while preserving muscle mass. Counseled on the health benefits of losing 5%-15% of total body weight. Was counseled on nutritional approaches to weight loss and benefits of reducing processed foods and consuming plant-based foods and high quality protein as part of nutritional weight management. Was counseled on pharmacotherapy and role as an adjunct in weight management.   Obesity Education Performed Today:  She was weighed on the bioimpedance scale and results were discussed and documented in the synopsis.  We discussed obesity as a disease and  the importance of a more detailed evaluation of all the factors contributing to the disease.  We discussed the importance of long term lifestyle changes which include nutrition, exercise and behavioral modifications as well as the importance of customizing this to her specific health and social needs.  We discussed the benefits of reaching a healthier weight to alleviate the symptoms of existing conditions and reduce the risks of the biomechanical, metabolic and psychological effects of obesity.  We reviewed the four pillars of obesity medicine and importance of using a multimodal approach.  We reviewed the basic principles in weight management.   Carly Stanley appears to be in the action stage of change and states they are ready to start intensive lifestyle modifications and behavioral modifications.  I have spent 25 minutes in the care of the patient today including: 3 minutes before the visit reviewing and preparing the chart. 18 minutes face-to-face assessing and reviewing listed medical problems as outlined in obesity care plan, providing nutritional and  behavioral counseling on topics outlined in the obesity care plan, independently interpreting test results and goals of care, as described in assessment and plan, and reviewing and discussing biometric information and progress 4 minutes after the visit updating chart and documentation of encounter.  Reviewed by clinician on day of visit: allergies, medications, problem list, medical history, surgical history, family history, social history, and previous encounter notes pertinent to obesity diagnosis.  Carly Stanley d. Carly Strider, NP-C

## 2024-03-29 ENCOUNTER — Ambulatory Visit: Payer: Self-pay | Admitting: Family

## 2024-03-29 ENCOUNTER — Encounter: Payer: Self-pay | Admitting: Dermatology

## 2024-04-01 ENCOUNTER — Other Ambulatory Visit: Payer: Self-pay | Admitting: Family

## 2024-04-01 DIAGNOSIS — N951 Menopausal and female climacteric states: Secondary | ICD-10-CM

## 2024-04-23 ENCOUNTER — Encounter (INDEPENDENT_AMBULATORY_CARE_PROVIDER_SITE_OTHER): Admitting: Internal Medicine

## 2024-05-01 ENCOUNTER — Encounter (INDEPENDENT_AMBULATORY_CARE_PROVIDER_SITE_OTHER): Payer: Self-pay

## 2024-05-25 ENCOUNTER — Other Ambulatory Visit: Payer: Self-pay | Admitting: Family

## 2024-05-25 DIAGNOSIS — N951 Menopausal and female climacteric states: Secondary | ICD-10-CM

## 2024-05-28 ENCOUNTER — Ambulatory Visit (INDEPENDENT_AMBULATORY_CARE_PROVIDER_SITE_OTHER): Admitting: Internal Medicine

## 2024-05-28 ENCOUNTER — Encounter (INDEPENDENT_AMBULATORY_CARE_PROVIDER_SITE_OTHER): Payer: Self-pay | Admitting: Internal Medicine

## 2024-05-28 VITALS — BP 125/86 | HR 59 | Temp 97.9°F | Ht 62.0 in | Wt 209.0 lb

## 2024-05-28 DIAGNOSIS — M0579 Rheumatoid arthritis with rheumatoid factor of multiple sites without organ or systems involvement: Secondary | ICD-10-CM

## 2024-05-28 DIAGNOSIS — R7303 Prediabetes: Secondary | ICD-10-CM | POA: Diagnosis not present

## 2024-05-28 DIAGNOSIS — R0602 Shortness of breath: Secondary | ICD-10-CM | POA: Diagnosis not present

## 2024-05-28 DIAGNOSIS — Z6838 Body mass index (BMI) 38.0-38.9, adult: Secondary | ICD-10-CM

## 2024-05-28 DIAGNOSIS — R748 Abnormal levels of other serum enzymes: Secondary | ICD-10-CM | POA: Diagnosis not present

## 2024-05-28 DIAGNOSIS — R29818 Other symptoms and signs involving the nervous system: Secondary | ICD-10-CM | POA: Diagnosis not present

## 2024-05-28 DIAGNOSIS — E669 Obesity, unspecified: Secondary | ICD-10-CM

## 2024-05-28 DIAGNOSIS — R5383 Other fatigue: Secondary | ICD-10-CM

## 2024-05-28 DIAGNOSIS — Z1331 Encounter for screening for depression: Secondary | ICD-10-CM

## 2024-05-28 DIAGNOSIS — N3281 Overactive bladder: Secondary | ICD-10-CM

## 2024-05-28 NOTE — Assessment & Plan Note (Signed)
 Patient has had elevated liver enzymes in the past she has multiple risk factors for fatty liver disease these include obesity and prediabetes.  We will check fasting liver enzymes make sure that she has had first tier evaluation.  Consider ultrasound imaging.  She benefits from reducing saturated fats simple and added sugars in diet.  Losing 10 to 15% of body weight may improve condition.

## 2024-05-28 NOTE — Assessment & Plan Note (Signed)
 We discussed the relationship between autoimmunity and obesity.  Patient is also on immunomodulators which may affect her weight.  We discussed the benefits of a plant-based diet.  Continue Xeljanz  as prescribed by rheumatology

## 2024-05-28 NOTE — Assessment & Plan Note (Signed)
 Most recent A1c is  Lab Results  Component Value Date   HGBA1C 5.9 02/28/2024   HGBA1C 5.8 (H) 09/11/2015    Patient aware of disease state and risk of progression. This may contribute to abnormal cravings, fatigue and diabetic complications without having diabetes.   We have discussed treatment options which include: losing 7 to 10% of body weight, increasing physical activity to a goal of 150 minutes a week at moderate intensity.  Advised to maintain a diet low on simple and processed carbohydrates.  She may also be a candidate for pharmacoprophylaxis with metformin or incretin mimetic.   Check fasting blood glucose and insulin  levels today.

## 2024-05-28 NOTE — Assessment & Plan Note (Signed)
 Carly Stanley admits to daytime somnolence and admits to waking up still tired. Patient has a history of symptoms of daytime fatigue and morning headache. Carly Stanley generally gets 6 hours of sleep per night, and states that she has nightime awakenings. Snoring is present. Apneic episodes are not present. Epworth Sleepiness Score is 10.  We will complete further screening and referral for polysomnography at the next office visit

## 2024-05-28 NOTE — Progress Notes (Signed)
 [address],  [place], [place] [address]  Office: [phone]  /  Fax: [phone]   Subjective   Initial Visit  Carly Stanley (MR# [id number]) is a 53 y.o. female who presents for evaluation and treatment of obesity and related comorbidities. Current BMI is Body mass index is 38.23 kg/m. [name] has been struggling with her weight for many years and has been unsuccessful in either losing weight, maintaining weight loss, or reaching her healthy weight goal.  [name] is currently in the action stage of change and ready to dedicate time achieving and maintaining a healthier weight. [name] is interested in becoming our patient and working on intensive lifestyle modifications including (but not limited to) diet and exercise for weight loss.  Weight history:  When asked how their weight has affected their life and health, she states: Contributed to medical problems, Contributed to orthopedic problems or mobility issues, Having fatigue, Having poor endurance, and Problems with eating patterns  When asked what else they would like to accomplish? She states: Adopt a healthier eating pattern and lifestyle, Improve energy levels and physical activity, Improve existing medical conditions, Reduce number of medications, and Improve quality of life  She starting to note weight gain during : Since diagnosis of RA in 2020.  Life events associated with weight gain include : medical illness.   Other contributing factors: disruption of circadian rhythm / sleep disordered breathing, consumption of processed foods, use of obesogenic medications: Beta-blockers and Psychotropic medications, moderate to high levels of stress, reduced physical acitivity, eating patterns, and menopause .  Multiple weight loss attempts  Their highest weight has been:  206 lbs.  Desired weight: 145-150  Previous weight-loss programs : Weight Watchers, [employer], and Ketogenic.  Their maximum weight loss was:  30 lbs.  Their  greatest challenge with dieting: difficulty maintaining reduced calorie state and meal preparation and cooking.  Current or previous pharmacotherapy: GLP-1. Coumpounded semaglutide  for 4 months  Response to medication: Lost weight initially but was unable to sustain weight loss   Nutritional History:  Current nutrition plan: None.  How many times do you eat outside the home: > 7 per week  How often do they skip meals: skips breakfast  What beverages do they drink: caffeinated beverages  and sweet tea .   Use of artificial sweetners : Yes  Food intolerances or dislikes: none.  Food triggers: Stress, Boredom, and None.  Food cravings: Pasta and Pizza  Do they struggle with excessive hunger or portion control : No    Physical Activity:  Current level of physical activity: Walking 20 minutes, once a week  Barriers to Exercise: time   Past medical history includes:   Past Medical History:  Diagnosis Date   BCC (basal cell carcinoma of skin) 12/26/2023   left temple, Mohs referral   Edema of both lower extremities    Encounter for general adult medical examination without abnormal findings 02/28/2024   Gallbladder problem    GERD (gastroesophageal reflux disease)    Hot flashes    Joint pain    Osteoarthritis    PAC (premature atrial contraction)    Palpitations    Pre-diabetes 2016   Rheumatoid arthritis (HCC)      Objective   BP 125/86   Pulse (!) 59   Temp 97.9 F (36.6 C)   Ht 5' 2 (1.575 m)   Wt 209 lb (94.8 kg)   LMP 11/10/2021 (Approximate)   SpO2 97%   BMI 38.23 kg/m  She was weighed  on the bioimpedance scale: Body mass index is 38.23 kg/m.    Anthropometrics:  Vitals Temp: 97.9 F (36.6 C) BP: 125/86 Pulse Rate: (!) 59 SpO2: 97 %   Anthropometric Measurements Height: 5' 2 (1.575 m) Weight: 209 lb (94.8 kg) BMI (Calculated): 38.22 Starting Weight: 209 lb Peak Weight: 209 lb Waist Measurement : 45 inches   Body Composition   Body Fat %: 47.1 % Fat Mass (lbs): 98.6 lbs Muscle Mass (lbs): 105.2 lbs Total Body Water (lbs): 78.5 lbs Visceral Fat Rating : 1512   Other Clinical Data Fasting: yes Labs: yes Today's Visit #: 1 Starting Date: 05/28/24    Physical Exam:  General: She is overweight, cooperative, alert, well developed, and in no acute distress. PSYCH: Has normal mood, affect and thought process.   HEENT: EOMI, sclerae are anicteric. Lungs: Normal breathing effort, no conversational dyspnea. Extremities: No edema.  Neurologic: No gross sensory or motor deficits. No tremors or fasciculations noted.    Diagnostic Data Reviewed  EKG: Normal sinus rhythm, rate 62. No conduction abnormalities, abnormal Q waves or chamber enlargement.  Indirect Calorimeter completed today shows a VO2 of 219 and a REE of 1512.  Her calculated basal metabolic rate is 8434 thus her resting energy expenditure slower than calculated.  Depression Screen  Ludmila's PHQ-9 score was: 9.     05/28/2024    8:58 AM  Depression screen PHQ 2/9  Decreased Interest 0  Down, Depressed, Hopeless 0  PHQ - 2 Score 0  Altered sleeping 3  Tired, decreased energy 2  Change in appetite 1  Feeling bad or failure about yourself  1  Trouble concentrating 1  Moving slowly or fidgety/restless 1  Suicidal thoughts 0  PHQ-9 Score 9  Difficult doing work/chores Not difficult at all    Screening for Sleep Related Breathing Disorders  Mccartney admits to daytime somnolence and admits to waking up still tired. Patient has a history of symptoms of daytime fatigue and morning headache. Cailee generally gets 6 hours of sleep per night, and states that she has nightime awakenings. Snoring is present. Apneic episodes are not present. Epworth Sleepiness Score is 10.   BMET    Component Value Date/Time   NA 139 02/28/2024 0923   NA 142 04/29/2023 0845   K 4.3 02/28/2024 0923   CL 106 02/28/2024 0923   CO2 26 02/28/2024 0923   GLUCOSE 96  02/28/2024 0923   BUN 13 02/28/2024 0923   BUN 15 04/29/2023 0845   CREATININE 0.76 02/28/2024 0923   CREATININE 0.80 12/28/2023 1152   CALCIUM 9.4 02/28/2024 0923   GFRNONAA 68 10/17/2020 1020   GFRAA 78 10/17/2020 1020   Lab Results  Component Value Date   HGBA1C 5.9 02/28/2024   HGBA1C 5.8 (H) 09/11/2015   No results found for: INSULIN  CBC    Component Value Date/Time   WBC 8.2 03/15/2024 1600   RBC 4.06 03/15/2024 1600   HGB 12.2 03/15/2024 1600   HGB 12.2 04/29/2023 0845   HCT 37.6 03/15/2024 1600   HCT 37.3 04/29/2023 0845   PLT 298 03/15/2024 1600   PLT 308 04/29/2023 0845   MCV 92.6 03/15/2024 1600   MCV 92 04/29/2023 0845   MCH 30.0 03/15/2024 1600   MCHC 32.4 03/15/2024 1600   RDW 12.4 03/15/2024 1600   RDW 12.1 04/29/2023 0845   Iron/TIBC/Ferritin/ %Sat No results found for: IRON, TIBC, FERRITIN, IRONPCTSAT Lipid Panel     Component Value Date/Time   CHOL 168 02/28/2024  0923   CHOL 173 04/29/2023 0845   TRIG 91.0 02/28/2024 0923   HDL 64.80 02/28/2024 0923   HDL 68 04/29/2023 0845   CHOLHDL 3 02/28/2024 0923   VLDL 18.2 02/28/2024 0923   LDLCALC 85 02/28/2024 0923   LDLCALC 91 04/29/2023 0845   Hepatic Function Panel     Component Value Date/Time   PROT 6.7 02/28/2024 0923   PROT 6.4 04/29/2023 0845   ALBUMIN 4.2 02/28/2024 0923   ALBUMIN 4.2 04/29/2023 0845   AST 16 02/28/2024 0923   ALT 20 02/28/2024 0923   ALKPHOS 74 02/28/2024 0923   BILITOT 0.4 02/28/2024 0923   BILITOT 0.3 04/29/2023 0845      Component Value Date/Time   TSH 3.07 02/28/2024 0923     Assessment and Plan   TREATMENT PLAN FOR OBESITY:  Recommended Dietary Goals  Chenell is currently in the action stage of change. As such, her goal is to implement medically supervised obesity management plan.  She has agreed to implement: the Category 2 plan - 1200 kcal per day  Behavioral Intervention  We discussed the following Behavioral Modification Strategies  today: increasing lean protein intake to established goals, decreasing simple carbohydrates , increasing vegetables, increasing lower glycemic fruits, increasing fiber rich foods, avoiding skipping meals, increasing water intake, work on meal planning and preparation, reading food labels , keeping healthy foods at home, identifying sources and decreasing liquid calories, decreasing eating out or consumption of processed foods, and making healthy choices when eating convenient foods, planning for success, and better snacking choices  Additional resources provided today: Handout on healthy eating and balanced plate, Handout on complex carbohydrates and lean sources of protein, and Category 2 packet  Recommended Physical Activity Goals  Jeremie has been advised to work up to 150 minutes of moderate intensity aerobic activity a week and strengthening exercises 2-3 times per week for cardiovascular health, weight loss maintenance and preservation of muscle mass.   She has agreed to :  Think about enjoyable ways to increase daily physical activity and overcoming barriers to exercise and Increase physical activity in their day and reduce sedentary time (increase NEAT).  Medical Interventions and Pharmacotherapy We will work on building a Therapist, art and behavioral strategies. We will discuss the role of pharmacotherapy as an adjunct at subsequent visits.   ASSOCIATED CONDITIONS ADDRESSED TODAY  Other Fatigue  Stanley does feel that her weight is causing her energy to be lower than it should be. Fatigue may be related to obesity, depression or many other causes. Labs will be ordered, and in the meanwhile, Jahzara will focus on self care including making healthy food choices, increasing physical activity and focusing on stress reduction.  Shortness of Breath Alantis notes increasing shortness of breath with physical activity and seems to be worsening over time with weight gain. She  notes getting out of breath sooner with activity than she used to. This has not gotten worse recently. Clay denies shortness of breath at rest or orthopnea.  Rheumatoid arthritis involving multiple sites with positive rheumatoid factor (HCC)  Pre-diabetes -     Insulin , random  Other fatigue -     EKG 12-Lead -     Vitamin B12  SOB (shortness of breath) on exertion  Depression screen  Elevated liver enzymes -     Comprehensive metabolic panel with GFR  Suspected sleep apnea  Marlina admits to daytime somnolence and admits to waking up still tired. Patient has a history of symptoms of  daytime fatigue and morning headache. Karolynn generally gets 6 hours of sleep per night, and states that she has nightime awakenings. Snoring is present. Apneic episodes are not present. Epworth Sleepiness Score is 10.  We will complete further screening and referral for polysomnography at the next office visit  Assessment & Plan Rheumatoid arthritis involving multiple sites with positive rheumatoid factor (HCC) We discussed the relationship between autoimmunity and obesity.  Patient is also on immunomodulators which may affect her weight.  We discussed the benefits of a plant-based diet.  Continue Xeljanz  as prescribed by rheumatology Pre-diabetes Most recent A1c is  Lab Results  Component Value Date   HGBA1C 5.9 02/28/2024   HGBA1C 5.8 (H) 09/11/2015    Patient aware of disease state and risk of progression. This may contribute to abnormal cravings, fatigue and diabetic complications without having diabetes.   We have discussed treatment options which include: losing 7 to 10% of body weight, increasing physical activity to a goal of 150 minutes a week at moderate intensity.  Advised to maintain a diet low on simple and processed carbohydrates.  She may also be a candidate for pharmacoprophylaxis with metformin or incretin mimetic.   Check fasting blood glucose and insulin  levels  today.  Other fatigue  SOB (shortness of breath) on exertion  Depression screen  Elevated liver enzymes Patient has had elevated liver enzymes in the past she has multiple risk factors for fatty liver disease these include obesity and prediabetes.  We will check fasting liver enzymes make sure that she has had first tier evaluation.  Consider ultrasound imaging.  She benefits from reducing saturated fats simple and added sugars in diet.  Losing 10 to 15% of body weight may improve condition. Suspected sleep apnea Karissa admits to daytime somnolence and admits to waking up still tired. Patient has a history of symptoms of daytime fatigue and morning headache. Blen generally gets 6 hours of sleep per night, and states that she has nightime awakenings. Snoring is present. Apneic episodes are not present. Epworth Sleepiness Score is 10.  We will complete further screening and referral for polysomnography at the next office visit OAB (overactive bladder) This may be a biomechanical complication of weight.  May improve with weight loss.   Follow-up  She was informed of the importance of frequent follow-up visits to maximize her success with intensive lifestyle modifications for her multiple health conditions. She was informed we would discuss her lab results at her next visit unless there is a critical issue that needs to be addressed sooner. Katryna agreed to keep her next visit at the agreed upon time to discuss these results.  Attestation Statement  This is the patient's intake visit at [facility]. The patient's Health Questionnaire was reviewed at length. Included in the packet: current and past health history, medications, allergies, ROS, gynecologic history (women only), surgical history, family history, social history, weight history, weight loss surgery history (for those that have had weight loss surgery), nutritional evaluation, mood and food questionnaire, PHQ9,  Epworth questionnaire, sleep habits questionnaire, patient life and health improvement goals questionnaire. These will all be scanned into the patient's chart under media.   During the visit, I independently reviewed the patient's EKG, previous labs, bioimpedance scale results, and indirect calorimetry results. I used this information to medically tailor a meal plan for the patient that will help her to lose weight and will improve her obesity-related conditions. I performed a medically necessary appropriate examination and/or evaluation. I discussed  the assessment and treatment plan with the patient. The patient was provided an opportunity to ask questions and all were answered. The patient agreed with the plan and demonstrated an understanding of the instructions. Labs were ordered at this visit and will be reviewed at the next visit unless critical results need to be addressed immediately. Clinical information was updated and documented in the EMR.   In addition, they received basic education on identification of processed foods and reduction of these, different sources of lean proteins and complex carbohydrates and how to eat balanced by incorporation of whole foods.  Reviewed by clinician on day of visit: allergies, medications, problem list, medical history, surgical history, family history, social history, and previous encounter notes.  I have spent 57 minutes in the care of the patient today including: 7 minutes before the visit reviewing and preparing the chart. 42 minutes face-to-face assessing and reviewing listed medical problems as outlined in obesity care plan, providing nutritional and behavioral counseling on topics outlined in the obesity care plan, independently interpreting test results and goals of care, as described in assessment and plan, reviewing and discussing biometric information and progress, and ordering diagnostics - see orders 8 minutes after the visit updating chart and  documentation of encounter.       Lucas Parker, MD

## 2024-05-28 NOTE — Assessment & Plan Note (Signed)
 This may be a biomechanical complication of weight.  May improve with weight loss.

## 2024-05-29 LAB — COMPREHENSIVE METABOLIC PANEL WITH GFR
ALT: 33 IU/L — ABNORMAL HIGH (ref 0–32)
AST: 25 IU/L (ref 0–40)
Albumin: 4.3 g/dL (ref 3.8–4.9)
Alkaline Phosphatase: 89 IU/L (ref 44–121)
BUN/Creatinine Ratio: 13 (ref 9–23)
BUN: 12 mg/dL (ref 6–24)
Bilirubin Total: 0.3 mg/dL (ref 0.0–1.2)
CO2: 22 mmol/L (ref 20–29)
Calcium: 9.4 mg/dL (ref 8.7–10.2)
Chloride: 105 mmol/L (ref 96–106)
Creatinine, Ser: 0.92 mg/dL (ref 0.57–1.00)
Globulin, Total: 2.4 g/dL (ref 1.5–4.5)
Glucose: 90 mg/dL (ref 70–99)
Potassium: 4.8 mmol/L (ref 3.5–5.2)
Sodium: 141 mmol/L (ref 134–144)
Total Protein: 6.7 g/dL (ref 6.0–8.5)
eGFR: 74 mL/min/1.73 (ref >59)

## 2024-05-29 LAB — VITAMIN B12: Vitamin B-12: 406 pg/mL (ref 232–1245)

## 2024-05-29 LAB — INSULIN, RANDOM: INSULIN: 10.2 u[IU]/mL (ref 2.6–24.9)

## 2024-06-11 ENCOUNTER — Ambulatory Visit (INDEPENDENT_AMBULATORY_CARE_PROVIDER_SITE_OTHER): Admitting: Internal Medicine

## 2024-06-11 VITALS — BP 110/74 | HR 56 | Temp 98.1°F | Ht 62.0 in | Wt 204.0 lb

## 2024-06-11 DIAGNOSIS — E66812 Obesity, class 2: Secondary | ICD-10-CM

## 2024-06-11 DIAGNOSIS — R748 Abnormal levels of other serum enzymes: Secondary | ICD-10-CM

## 2024-06-11 DIAGNOSIS — R7303 Prediabetes: Secondary | ICD-10-CM

## 2024-06-11 DIAGNOSIS — E559 Vitamin D deficiency, unspecified: Secondary | ICD-10-CM | POA: Diagnosis not present

## 2024-06-11 DIAGNOSIS — Z6837 Body mass index (BMI) 37.0-37.9, adult: Secondary | ICD-10-CM

## 2024-06-11 DIAGNOSIS — R29818 Other symptoms and signs involving the nervous system: Secondary | ICD-10-CM

## 2024-06-11 NOTE — Progress Notes (Signed)
 Office: [phone]  /  Fax: [phone]  Weight Summary and Body Composition Analysis (BIA)  Vitals Temp: 98.1 F (36.7 C) BP: 110/74 Pulse Rate: (!) 56 SpO2: 98 %   Anthropometric Measurements Height: 5' 2 (1.575 m) Weight: 204 lb (92.5 kg) BMI (Calculated): 37.3 Weight at Last Visit: 209 lb Weight Lost Since Last Visit: 5 lb Weight Gained Since Last Visit: 0 lb Starting Weight: 209 lb Total Weight Loss (lbs): 5 lb (2.268 kg) Peak Weight: 209 lb   Body Composition  Body Fat %: 44.3 % Fat Mass (lbs): 90.4 lbs Muscle Mass (lbs): 108 lbs Total Body Water (lbs): 73.2 lbs Visceral Fat Rating : 13  The 10-year ASCVD risk score (Arnett DK, et al., 2019) is: 1.1%    RMR: 1512  Today's Visit #: 2  Starting Date: 05/28/24   Subjective   Chief Complaint: Obesity  Interval History Discussed the use of AI scribe software for clinical note transcription with the patient, who gave verbal consent to proceed.  History of Present Illness   Carly Stanley is a 53 year old female with prediabetes and rheumatoid arthritis who presents for medical weight management.  She has lost five pounds since her last visit, attributing this to following a 1200 calorie nutrition plan 80% of the time, focusing on whole foods and adequate protein intake. She exercises twice a week, engaging in 20 minutes of cardio each session. However, she struggles with maintaining her meal plan due to a busy schedule, often skipping meals which leads to nausea and shakiness.  She has a history of prediabetes, with an A1c of 5.9% noted in April, placing her in the prediabetic range. Her fasting blood sugar is 90 mg/dL, and her insulin  level is 10.2, which is slightly above optimal levels.  She has a history of vitamin D  deficiency diagnosed in April, for which she completed a course of 50,000 IU weekly and is now on a maintenance dose of 2000 IU daily.  She has rheumatoid arthritis and is on biologics  for management.  She suspects she may have sleep apnea, reporting symptoms such as daytime somnolence, waking up tired, and occasional headaches. She has been told she snores but has not been observed to stop breathing during sleep. Her Epworth Sleepiness Scale score is 10.  Her family history is significant for her father being diagnosed with bile duct cancer in June, which has metastasized to his liver and abdominal wall. Genetic testing is being conducted to determine if there is a hereditary component.  She is concerned about elevated liver enzymes, with an ALT of 33, slightly above the normal range. She has no history of emphysema or celiac disease. She has not had an abdominal ultrasound before.       Challenges affecting patient progress: low volume of physical activity at present .    Pharmacotherapy for weight management: She is currently taking no anti-obesity medication.   Assessment and Plan   Treatment Plan For Obesity:  Recommended Dietary Goals  Carly Stanley is currently in the action stage of change. As such, her goal is to continue weight management plan. She has agreed to: continue current plan  Behavioral Health and Counseling  We discussed the following behavioral modification strategies today: continue to work on maintaining a reduced calorie state, getting the recommended amount of protein, incorporating whole foods, making healthy choices, staying well hydrated and practicing mindfulness when eating..  Additional education and resources provided today: Handout on increasing daily activity and exercise goal  setting  Recommended Physical Activity Goals  Carly Stanley has been advised to work up to 150 minutes of moderate intensity aerobic activity a week and strengthening exercises 2-3 times per week for cardiovascular health, weight loss maintenance and preservation of muscle mass.   She has agreed to :  Think about enjoyable ways to increase daily physical activity and  overcoming barriers to exercise and Increase physical activity in their day and reduce sedentary time (increase NEAT).  Medical Interventions and Pharmacotherapy  We discussed various medication options to help Carly Stanley with her weight loss efforts and we both agreed to : She does not have coverage for GLP-1 but is interested in antiobesity medications.  She will look into Qsymia we will discuss further at the next office visit  Associated Conditions Impacted by Obesity Treatment  Assessment & Plan Elevated liver enzymes Mildly elevated liver enzymes with ALT at 33, previously 42. Differential diagnosis includes MASLD, autoimmune liver disease, and other etiologies. Family history of bile duct cancer in father. No history of viral hepatitis risk factors. Potential for fatty liver disease and need for further evaluation.  Because of history of autoimmune disease, autoimmune conditions could affect the liver, requiring further testing to rule out structural abnormalities and other conditions. - Order liver ultrasound to evaluate for structural abnormalities. - Order blood tests including anti-smooth muscle antibody, hepatitis B and C antibodies, and iron studies for hemochromatosis. Suspected sleep apnea She has an elevated Epworth neck circumference is 14 inches she has a Mallampati close to 3.  She will be referred for sleep study through [facility]. Pre-diabetes Most recent A1c is  Lab Results  Component Value Date   HGBA1C 5.9 02/28/2024   HGBA1C 5.8 (H) 09/11/2015    Patient aware of disease state and risk of progression. This may contribute to abnormal cravings, fatigue and diabetic complications without having diabetes.   We have discussed treatment options which include: losing 7 to 10% of body weight, increasing physical activity to a goal of 150 minutes a week at moderate intensity.  Advised to maintain a diet low on simple and processed carbohydrates.  She  may also be a candidate for pharmacoprophylaxis with metformin or incretin mimetic.    Obesity, Class II, BMI 35-39.9 Weight loss of 5 pounds since last visit, with increased muscle mass and decreased body fat percentage. Current nutrition plan includes a 1200 calorie diet, with challenges in meal planning due to a busy schedule. Suspected obstructive sleep apnea due to symptoms of daytime somnolence, fatigue, and snoring, with a Malampati score of 3 and neck circumference of 14 inches. Discussed the bidirectional relationship between weight and sleep apnea, and the potential benefits of weight loss on sleep apnea symptoms.  - Refer to [facility] for sleep study evaluation. - Encourage continuation of current nutrition plan with emphasis on meal planning and use of meal replacements as needed. - Discuss potential use of weight loss medications Vitamin D  deficiency Her last value was around 27 she has completed high-dose vitamin D  supplementation and is now taking over-the-counter vitamin D3 2000 international units daily.  She will continue current regimen            Objective   Physical Exam:  Blood pressure 110/74, pulse (!) 56, temperature 98.1 F (36.7 C), height 5' 2 (1.575 m), weight 204 lb (92.5 kg), last menstrual period 11/10/2021, SpO2 98%. Body mass index is 37.31 kg/m.  General: She is overweight, cooperative, alert, well developed, and in no acute  distress. PSYCH: Has normal mood, affect and thought process.   HEENT: EOMI, sclerae are anicteric. Lungs: Normal breathing effort, no conversational dyspnea. Extremities: No edema.  Neurologic: No gross sensory or motor deficits. No tremors or fasciculations noted.    Diagnostic Data Reviewed:  BMET    Component Value Date/Time   NA 141 05/28/2024 1002   K 4.8 05/28/2024 1002   CL 105 05/28/2024 1002   CO2 22 05/28/2024 1002   GLUCOSE 90 05/28/2024 1002   GLUCOSE 96 02/28/2024 0923   BUN 12  05/28/2024 1002   CREATININE 0.92 05/28/2024 1002   CREATININE 0.80 12/28/2023 1152   CALCIUM 9.4 05/28/2024 1002   GFRNONAA 68 10/17/2020 1020   GFRAA 78 10/17/2020 1020   Lab Results  Component Value Date   HGBA1C 5.9 02/28/2024   HGBA1C 5.8 (H) 09/11/2015   Lab Results  Component Value Date   INSULIN  10.2 05/28/2024   Lab Results  Component Value Date   TSH 3.07 02/28/2024   CBC    Component Value Date/Time   WBC 8.2 03/15/2024 1600   RBC 4.06 03/15/2024 1600   HGB 12.2 03/15/2024 1600   HGB 12.2 04/29/2023 0845   HCT 37.6 03/15/2024 1600   HCT 37.3 04/29/2023 0845   PLT 298 03/15/2024 1600   PLT 308 04/29/2023 0845   MCV 92.6 03/15/2024 1600   MCV 92 04/29/2023 0845   MCH 30.0 03/15/2024 1600   MCHC 32.4 03/15/2024 1600   RDW 12.4 03/15/2024 1600   RDW 12.1 04/29/2023 0845   Iron Studies No results found for: IRON, TIBC, FERRITIN, IRONPCTSAT Lipid Panel     Component Value Date/Time   CHOL 168 02/28/2024 0923   CHOL 173 04/29/2023 0845   TRIG 91.0 02/28/2024 0923   HDL 64.80 02/28/2024 0923   HDL 68 04/29/2023 0845   CHOLHDL 3 02/28/2024 0923   VLDL 18.2 02/28/2024 0923   LDLCALC 85 02/28/2024 0923   LDLCALC 91 04/29/2023 0845   Hepatic Function Panel     Component Value Date/Time   PROT 6.7 05/28/2024 1002   ALBUMIN 4.3 05/28/2024 1002   AST 25 05/28/2024 1002   ALT 33 (H) 05/28/2024 1002   ALKPHOS 89 05/28/2024 1002   BILITOT 0.3 05/28/2024 1002      Component Value Date/Time   TSH 3.07 02/28/2024 0923   Nutritional Lab Results  Component Value Date   VD25OH 26.30 (L) 02/28/2024   VD25OH 21.0 (L) 01/12/2023   VD25OH 36.6 09/20/2016    Medications: Outpatient Encounter Medications as of 06/11/2024  Medication Sig   cetirizine (ZYRTEC) 10 MG tablet Take 10 mg by mouth daily.   Cholecalciferol  1.25 MG (50000 UT) TABS Take 1 tablet by mouth once a week. (Patient not taking: Reported on 05/28/2024)   citalopram  (CELEXA ) 10 MG  tablet TAKE 1 TABLET BY MOUTH EVERY DAY   ibuprofen  (ADVIL ) 200 MG tablet Take by mouth as needed. (Patient not taking: Reported on 05/28/2024)   nebivolol  (BYSTOLIC ) 10 MG tablet Take 10 mg twice a day, may take extra 10 mg as needed for breakthrough palpitations   XELJANZ  XR 11 MG TB24 Take 1 tablet (11 mg total) by mouth daily.   No facility-administered encounter medications on file as of 06/11/2024.     Follow-Up   Return in about 4 weeks (around 07/09/2024) for For Weight Mangement with Dr. [email or web address].[email or web address] She was informed of the importance of frequent follow up visits to maximize her success with intensive lifestyle modifications for  her multiple health conditions.  Attestation Statement   Reviewed by clinician on day of visit: allergies, medications, problem list, medical history, surgical history, family history, social history, and previous encounter notes.   I have spent 53 minutes in the care of the patient today including: 3 minutes before the visit reviewing and preparing the chart. 41 minutes face-to-face assessing and reviewing listed medical problems as outlined in obesity care plan, providing nutritional and behavioral counseling on topics outlined in the obesity care plan, counseling regarding anti-obesity medication as outlined in obesity care plan, independently interpreting test results and goals of care, as described in assessment and plan, reviewing and discussing biometric information and progress, managing referral , ordering diagnostics - see orders, and ordering medications - see orders 9 minutes after the visit updating chart and documentation of encounter.    Lucas Parker, MD

## 2024-06-11 NOTE — Assessment & Plan Note (Signed)
 Weight loss of 5 pounds since last visit, with increased muscle mass and decreased body fat percentage. Current nutrition plan includes a 1200 calorie diet, with challenges in meal planning due to a busy schedule. Suspected obstructive sleep apnea due to symptoms of daytime somnolence, fatigue, and snoring, with a Malampati score of 3 and neck circumference of 14 inches. Discussed the bidirectional relationship between weight and sleep apnea, and the potential benefits of weight loss on sleep apnea symptoms.  - Refer to [facility] for sleep study evaluation. - Encourage continuation of current nutrition plan with emphasis on meal planning and use of meal replacements as needed. - Discuss potential use of weight loss medications

## 2024-06-11 NOTE — Assessment & Plan Note (Signed)
 Her last value was around 27 she has completed high-dose vitamin D  supplementation and is now taking over-the-counter vitamin D3 2000 international units daily.  She will continue current regimen

## 2024-06-11 NOTE — Assessment & Plan Note (Signed)
 Most recent A1c is  Lab Results  Component Value Date   HGBA1C 5.9 02/28/2024   HGBA1C 5.8 (H) 09/11/2015    Patient aware of disease state and risk of progression. This may contribute to abnormal cravings, fatigue and diabetic complications without having diabetes.   We have discussed treatment options which include: losing 7 to 10% of body weight, increasing physical activity to a goal of 150 minutes a week at moderate intensity.  Advised to maintain a diet low on simple and processed carbohydrates.  She may also be a candidate for pharmacoprophylaxis with metformin or incretin mimetic.

## 2024-06-11 NOTE — Assessment & Plan Note (Signed)
 Mildly elevated liver enzymes with ALT at 33, previously 42. Differential diagnosis includes MASLD, autoimmune liver disease, and other etiologies. Family history of bile duct cancer in father. No history of viral hepatitis risk factors. Potential for fatty liver disease and need for further evaluation.  Because of history of autoimmune disease, autoimmune conditions could affect the liver, requiring further testing to rule out structural abnormalities and other conditions. - Order liver ultrasound to evaluate for structural abnormalities. - Order blood tests including anti-smooth muscle antibody, hepatitis B and C antibodies, and iron studies for hemochromatosis.

## 2024-06-11 NOTE — Assessment & Plan Note (Signed)
 She has an elevated Epworth neck circumference is 14 inches she has a Mallampati close to 3.  She will be referred for sleep study through [facility].

## 2024-06-13 LAB — HEPATITIS B CORE ANTIBODY, IGM: Hep B C IgM: NEGATIVE

## 2024-06-13 LAB — ANTI-SMOOTH MUSCLE ANTIBODY, IGG: Smooth Muscle Ab: 7 U (ref 0–19)

## 2024-06-13 LAB — IRON AND TIBC
Iron Saturation: 25 % (ref 15–55)
Iron: 93 ug/dL (ref 27–159)
Total Iron Binding Capacity: 369 ug/dL (ref 250–450)
UIBC: 276 ug/dL (ref 131–425)

## 2024-06-13 LAB — HEPATITIS B SURFACE ANTIBODY,QUALITATIVE: Hep B Surface Ab, Qual: NONREACTIVE

## 2024-06-13 LAB — FERRITIN: Ferritin: 125 ng/mL (ref 15–150)

## 2024-06-13 LAB — HEPATITIS C ANTIBODY: Hep C Virus Ab: NONREACTIVE

## 2024-06-13 LAB — HEPATITIS B SURFACE ANTIGEN: Hepatitis B Surface Ag: NEGATIVE

## 2024-06-13 LAB — CERULOPLASMIN: Ceruloplasmin: 35.1 mg/dL (ref 19.0–39.0)

## 2024-06-18 ENCOUNTER — Ambulatory Visit (INDEPENDENT_AMBULATORY_CARE_PROVIDER_SITE_OTHER): Payer: Self-pay | Admitting: Internal Medicine

## 2024-06-18 ENCOUNTER — Ambulatory Visit
Admission: RE | Admit: 2024-06-18 | Discharge: 2024-06-18 | Disposition: A | Discharge status: Home / Self Care | Source: Ambulatory Visit | Attending: Internal Medicine | Admitting: Internal Medicine

## 2024-06-18 ENCOUNTER — Other Ambulatory Visit: Payer: Self-pay | Admitting: Physician Assistant

## 2024-06-18 DIAGNOSIS — R748 Abnormal levels of other serum enzymes: Secondary | ICD-10-CM | POA: Insufficient documentation

## 2024-06-18 NOTE — Telephone Encounter (Signed)
 Last Fill: 03/16/2024  Labs: 05/28/2024 CMP  ALT 33 03/15/2024 CBC  CK WNL  Magnesium WNL  Absolute monocytes are slightly elevated. Rest of CBC WNL   TB Gold: 12/08/2023 TB Gold Negative    Next Visit: 08/21/2024  Last Visit: 03/15/2024  IK:Myzlfjunpi arthritis involving multiple sites with positive rheumatoid factor   Current Dose per office note 03/15/2024: Xeljanz  11 mg XR 1 tablet by mouth daily   Okay to refill Xeljanz ?   Contacted patient to update labs, provided with lab hours.

## 2024-06-26 ENCOUNTER — Ambulatory Visit (INDEPENDENT_AMBULATORY_CARE_PROVIDER_SITE_OTHER): Admitting: Internal Medicine

## 2024-06-26 ENCOUNTER — Encounter (INDEPENDENT_AMBULATORY_CARE_PROVIDER_SITE_OTHER): Payer: Self-pay | Admitting: Internal Medicine

## 2024-06-26 VITALS — BP 109/73 | HR 57 | Temp 97.7°F | Ht 62.0 in | Wt 203.0 lb

## 2024-06-26 DIAGNOSIS — Z6837 Body mass index (BMI) 37.0-37.9, adult: Secondary | ICD-10-CM

## 2024-06-26 DIAGNOSIS — Z723 Lack of physical exercise: Secondary | ICD-10-CM | POA: Diagnosis not present

## 2024-06-26 DIAGNOSIS — R748 Abnormal levels of other serum enzymes: Secondary | ICD-10-CM

## 2024-06-26 DIAGNOSIS — R7303 Prediabetes: Secondary | ICD-10-CM | POA: Diagnosis not present

## 2024-06-26 DIAGNOSIS — K760 Fatty (change of) liver, not elsewhere classified: Secondary | ICD-10-CM | POA: Diagnosis not present

## 2024-06-26 DIAGNOSIS — E66812 Obesity, class 2: Secondary | ICD-10-CM

## 2024-06-27 NOTE — Assessment & Plan Note (Signed)
 Ultrasound indicates fatty liver with increased echotexture, likely causing mild elevation in liver enzymes. Negative for viral hepatitis, iron deposition, copper deposition, and autoimmune hepatitis. No alcohol consumption. Condition is treatable with weight loss and dietary changes, including reducing saturated fats and simple sugars, which also aligns with treatment for prediabetes. - Advise weight loss of 10-15% of body weight - Recommend reducing saturated fats and simple sugars - Encourage physical activity up to 240 minutes per week - Educate on reading food labels for saturated fat and sugar content  Reported difficulty in meeting protein intake goals. Current diet includes grits and coffee for breakfast, healthy frozen meals for lunch, and insufficient protein intake overall. Protein intake is crucial for weight management and liver health. Discussed using meal replacements and prepackaged food services for convenience. - Recommend meal replacements such as protein bars or shakes with whole foods - Advise increasing protein intake in meals, e.g., adding protein powder to grits - Suggest using prepackaged food services for convenience - Provide list of protein shakes and bars - Encourage use of [employer] website for high-protein meal ideas

## 2024-06-27 NOTE — Assessment & Plan Note (Signed)
 Most recent A1c is  Lab Results  Component Value Date   HGBA1C 5.9 02/28/2024   HGBA1C 5.8 (H) 09/11/2015    Patient aware of disease state and risk of progression. This may contribute to abnormal cravings, fatigue and diabetic complications without having diabetes.   We have discussed treatment options which include: losing 7 to 10% of body weight, increasing physical activity to a goal of 150 minutes a week at moderate intensity.  Advised to maintain a diet low on simple and processed carbohydrates.  She may also be a candidate for pharmacoprophylaxis with metformin or incretin mimetic.

## 2024-06-27 NOTE — Assessment & Plan Note (Signed)
 I reviewed evaluation thus far.  Her iron studies are normal.  She has negative hepatitis serologies she had negative ceruloplasmin, and anti-smooth muscle antibody.  Ultrasound of the liver shows hepatic steatosis.  Findings are likely consistent with fatty liver disease.  Patient will work on reducing simple and added sugars in diet and also saturated fats.  She does not have coverage for GLP-1.  Losing 10 to 15% of body weight may improve condition.   Fibrosis 4 Score = .8 (Low risk)        Interpretation for patients with NAFLD          <1.30       -  F0-F1 (Low risk)          1.30-2.67 -  Indeterminate           >2.67      -  F3-F4 (High risk)     Validated for ages 55-65

## 2024-06-27 NOTE — Progress Notes (Signed)
 Office: [phone]  /  Fax: [phone]  Weight Summary and Body Composition Analysis (BIA)  Vitals Temp: 97.7 F (36.5 C) BP: 109/73 Pulse Rate: (!) 57 SpO2: 97 %   Anthropometric Measurements Height: 5' 2 (1.575 m) Weight: 203 lb (92.1 kg) BMI (Calculated): 37.12 Weight at Last Visit: 204lb Weight Lost Since Last Visit: 1lb Weight Gained Since Last Visit: 0lb Starting Weight: 209lb Total Weight Loss (lbs): 6 lb (2.722 kg) Peak Weight: 209lb Waist Measurement : 45 inches   Body Composition  Body Fat %: 44.4 % Fat Mass (lbs): 90.4 lbs Muscle Mass (lbs): 107.4 lbs Total Body Water (lbs): 74 lbs Visceral Fat Rating : 13    RMR: 1512  Today's Visit #: 3  Starting Date: 05/28/24   Subjective   Chief Complaint: Obesity  Interval History Discussed the use of AI scribe software for clinical note transcription with the patient, who gave verbal consent to proceed.  History of Present Illness Carly Stanley is a 53 year old female with obesity and rheumatoid arthritis who presents for weight management and evaluation of fatty liver disease.  She faces challenges in maintaining her calorie target of 1200 calories per day, particularly in ensuring adequate protein intake. Hunger is a significant issue, especially during work hours, leading to a tendency to eat when she sits down. She is attempting to have three meals a day and is trying to incorporate protein into her meals, such as adding protein to salads. Her current diet includes grits and coffee in the morning, healthy frozen meals for lunch, and she tries to incorporate fruits like apples or grapes. She does not get enough water intake and often has to remind herself to drink fluids.  She underwent an ultrasound on June 18, 2024, which showed increased echotexture of the liver. Her ultrasound did not show any abnormalities in the portal vein, gallbladder, or ducts. She has had a work-up for other causes of liver  enzyme elevation, including tests for iron and copper deposition, viral hepatitis, and autoimmune hepatitis, all of which were negative. No alcohol consumption.  Rheumatoid arthritis makes physical activity challenging due to fatigue and stiffness. She experiences morning stiffness and active disease symptoms, which limit her ability to exercise. Despite this, she acknowledges the benefits of exercise for RA, including anti-inflammatory effects and maintaining joint range of motion.  She struggles with sleep, particularly due to shift work, which disrupts her circadian rhythm. She does not sleep well and attributes some of her weight management challenges to this issue.     Challenges affecting patient progress: difficulty implementing reduced calorie nutrition plan and difficulty maintaining a reduced calorie state.    Pharmacotherapy for weight management: She is currently taking no anti-obesity medication.   Assessment and Plan   Treatment Plan For Obesity:  Recommended Dietary Goals  Rebel is currently in the action stage of change. As such, her goal is to continue weight management plan. She has agreed to: continue current plan  Behavioral Health and Counseling  We discussed the following behavioral modification strategies today: continue to work on maintaining a reduced calorie state, getting the recommended amount of protein, incorporating whole foods, making healthy choices, staying well hydrated and practicing mindfulness when eating. and increase protein intake, fibrous foods (25 grams per day for women, 30 grams for men) and water to improve satiety and decrease hunger signals. .  Additional education and resources provided today: None  Recommended Physical Activity Goals  Darice has been advised to work up  to 150 minutes of moderate intensity aerobic activity a week and strengthening exercises 2-3 times per week for cardiovascular health, weight loss maintenance and  preservation of muscle mass.  She has agreed to :  Think about enjoyable ways to increase daily physical activity and overcoming barriers to exercise, Increase physical activity in their day and reduce sedentary time (increase NEAT)., Increase volume of physical activity to a goal of 240 minutes a week, and Combine aerobic and strengthening exercises for efficiency and improved cardiometabolic health.  Medical Interventions and Pharmacotherapy  We discussed various medication options to help Kimiah with her weight loss efforts and we both agreed to : Continue with current nutritional and behavioral strategies  Associated Conditions Impacted by Obesity Treatment  Assessment & Plan Obesity, Class II, BMI 35-39.9 Ultrasound indicates fatty liver with increased echotexture, likely causing mild elevation in liver enzymes. Negative for viral hepatitis, iron deposition, copper deposition, and autoimmune hepatitis. No alcohol consumption. Condition is treatable with weight loss and dietary changes, including reducing saturated fats and simple sugars, which also aligns with treatment for prediabetes. - Advise weight loss of 10-15% of body weight - Recommend reducing saturated fats and simple sugars - Encourage physical activity up to 240 minutes per week - Educate on reading food labels for saturated fat and sugar content  Reported difficulty in meeting protein intake goals. Current diet includes grits and coffee for breakfast, healthy frozen meals for lunch, and insufficient protein intake overall. Protein intake is crucial for weight management and liver health. Discussed using meal replacements and prepackaged food services for convenience. - Recommend meal replacements such as protein bars or shakes with whole foods - Advise increasing protein intake in meals, e.g., adding protein powder to grits - Suggest using prepackaged food services for convenience - Provide list of protein shakes and bars -  Encourage use of [employer] website for high-protein meal ideas Pre-diabetes Most recent A1c is  Lab Results  Component Value Date   HGBA1C 5.9 02/28/2024   HGBA1C 5.8 (H) 09/11/2015    Patient aware of disease state and risk of progression. This may contribute to abnormal cravings, fatigue and diabetic complications without having diabetes.   We have discussed treatment options which include: losing 7 to 10% of body weight, increasing physical activity to a goal of 150 minutes a week at moderate intensity.  Advised to maintain a diet low on simple and processed carbohydrates.  She may also be a candidate for pharmacoprophylaxis with metformin or incretin mimetic.    Elevated liver enzymes Metabolic dysfunction-associated steatotic liver disease (MASLD) I reviewed evaluation thus far.  Her iron studies are normal.  She has negative hepatitis serologies she had negative ceruloplasmin, and anti-smooth muscle antibody.  Ultrasound of the liver shows hepatic steatosis.  Findings are likely consistent with fatty liver disease.  Patient will work on reducing simple and added sugars in diet and also saturated fats.  She does not have coverage for GLP-1.  Losing 10 to 15% of body weight may improve condition.   Fibrosis 4 Score = .8 (Low risk)        Interpretation for patients with NAFLD          <1.30       -  F0-F1 (Low risk)          1.30-2.67 -  Indeterminate           >2.67      -  F3-F4 (High risk)     Validated for  ages 49-65        Physically inactive Current physical activity level is low, partly due to rheumatoid arthritis (RA) symptoms. Exercise is beneficial for RA due to its anti-inflammatory effects and helps in weight management. Discussed overcoming RA-related fatigue and stiffness with appropriate treatment and finding enjoyable activities. - Encourage finding enjoyable physical activities such as Pilates, Tai Chi, chair exercises, or aqua aerobics - Discuss benefits of  exercise for RA and overall health - Advise gradual increase in physical activity to improve metabolic rate and mental health         Objective   Physical Exam:  Blood pressure 109/73, pulse (!) 57, temperature 97.7 F (36.5 C), height 5' 2 (1.575 m), weight 203 lb (92.1 kg), last menstrual period 11/10/2021, SpO2 97%. Body mass index is 37.13 kg/m.  General: She is overweight, cooperative, alert, well developed, and in no acute distress. PSYCH: Has normal mood, affect and thought process.   HEENT: EOMI, sclerae are anicteric. Lungs: Normal breathing effort, no conversational dyspnea. Extremities: No edema.  Neurologic: No gross sensory or motor deficits. No tremors or fasciculations noted.    Diagnostic Data Reviewed:  BMET    Component Value Date/Time   NA 141 05/28/2024 1002   K 4.8 05/28/2024 1002   CL 105 05/28/2024 1002   CO2 22 05/28/2024 1002   GLUCOSE 90 05/28/2024 1002   GLUCOSE 96 02/28/2024 0923   BUN 12 05/28/2024 1002   CREATININE 0.92 05/28/2024 1002   CREATININE 0.80 12/28/2023 1152   CALCIUM 9.4 05/28/2024 1002   GFRNONAA 68 10/17/2020 1020   GFRAA 78 10/17/2020 1020   Lab Results  Component Value Date   HGBA1C 5.9 02/28/2024   HGBA1C 5.8 (H) 09/11/2015   Lab Results  Component Value Date   INSULIN  10.2 05/28/2024   Lab Results  Component Value Date   TSH 3.07 02/28/2024   CBC    Component Value Date/Time   WBC 8.2 03/15/2024 1600   RBC 4.06 03/15/2024 1600   HGB 12.2 03/15/2024 1600   HGB 12.2 04/29/2023 0845   HCT 37.6 03/15/2024 1600   HCT 37.3 04/29/2023 0845   PLT 298 03/15/2024 1600   PLT 308 04/29/2023 0845   MCV 92.6 03/15/2024 1600   MCV 92 04/29/2023 0845   MCH 30.0 03/15/2024 1600   MCHC 32.4 03/15/2024 1600   RDW 12.4 03/15/2024 1600   RDW 12.1 04/29/2023 0845   Iron Studies    Component Value Date/Time   IRON 93 06/11/2024 1146   TIBC 369 06/11/2024 1146   FERRITIN 125 06/11/2024 1146   IRONPCTSAT 25  06/11/2024 1146   Lipid Panel     Component Value Date/Time   CHOL 168 02/28/2024 0923   CHOL 173 04/29/2023 0845   TRIG 91.0 02/28/2024 0923   HDL 64.80 02/28/2024 0923   HDL 68 04/29/2023 0845   CHOLHDL 3 02/28/2024 0923   VLDL 18.2 02/28/2024 0923   LDLCALC 85 02/28/2024 0923   LDLCALC 91 04/29/2023 0845   Hepatic Function Panel     Component Value Date/Time   PROT 6.7 05/28/2024 1002   ALBUMIN 4.3 05/28/2024 1002   AST 25 05/28/2024 1002   ALT 33 (H) 05/28/2024 1002   ALKPHOS 89 05/28/2024 1002   BILITOT 0.3 05/28/2024 1002      Component Value Date/Time   TSH 3.07 02/28/2024 0923   Nutritional Lab Results  Component Value Date   VD25OH 26.30 (L) 02/28/2024   VD25OH 21.0 (L) 01/12/2023  VD25OH 36.6 09/20/2016    Medications: Outpatient Encounter Medications as of 06/26/2024  Medication Sig   cetirizine (ZYRTEC) 10 MG tablet Take 10 mg by mouth daily.   citalopram  (CELEXA ) 10 MG tablet TAKE 1 TABLET BY MOUTH EVERY DAY   nebivolol  (BYSTOLIC ) 10 MG tablet Take 10 mg twice a day, may take extra 10 mg as needed for breakthrough palpitations   XELJANZ  XR 11 MG TB24 TAKE 1 TABLET DAILY   ibuprofen  (ADVIL ) 200 MG tablet Take by mouth as needed. (Patient not taking: Reported on 06/26/2024)   No facility-administered encounter medications on file as of 06/26/2024.     Follow-Up   Return in about 3 weeks (around 07/17/2024) for For Weight Mangement with Dr. Francyne.SABRA She was informed of the importance of frequent follow up visits to maximize her success with intensive lifestyle modifications for her multiple health conditions.  Attestation Statement   Reviewed by clinician on day of visit: allergies, medications, problem list, medical history, surgical history, family history, social history, and previous encounter notes.     Lucas Francyne, MD

## 2024-06-28 ENCOUNTER — Other Ambulatory Visit: Payer: Self-pay | Admitting: Family

## 2024-06-28 DIAGNOSIS — N951 Menopausal and female climacteric states: Secondary | ICD-10-CM

## 2024-07-12 ENCOUNTER — Institutional Professional Consult (permissible substitution): Admitting: Neurology

## 2024-07-20 ENCOUNTER — Encounter (INDEPENDENT_AMBULATORY_CARE_PROVIDER_SITE_OTHER): Payer: Self-pay | Admitting: Internal Medicine

## 2024-07-23 ENCOUNTER — Ambulatory Visit (INDEPENDENT_AMBULATORY_CARE_PROVIDER_SITE_OTHER): Admitting: Internal Medicine

## 2024-07-23 ENCOUNTER — Other Ambulatory Visit: Payer: Self-pay | Admitting: Physician Assistant

## 2024-07-23 NOTE — Telephone Encounter (Signed)
 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 Patient to update labs at upcomming appointment.  Okay to refill Xeljanz ?

## 2024-07-24 ENCOUNTER — Other Ambulatory Visit: Payer: Self-pay | Admitting: *Deleted

## 2024-07-24 ENCOUNTER — Ambulatory Visit: Admitting: Neurology

## 2024-07-24 ENCOUNTER — Encounter: Payer: Self-pay | Admitting: Neurology

## 2024-07-24 VITALS — BP 129/82 | HR 63 | Ht 62.0 in | Wt 207.0 lb

## 2024-07-24 DIAGNOSIS — E669 Obesity, unspecified: Secondary | ICD-10-CM

## 2024-07-24 DIAGNOSIS — R519 Headache, unspecified: Secondary | ICD-10-CM | POA: Diagnosis not present

## 2024-07-24 DIAGNOSIS — G4719 Other hypersomnia: Secondary | ICD-10-CM

## 2024-07-24 DIAGNOSIS — Z9189 Other specified personal risk factors, not elsewhere classified: Secondary | ICD-10-CM | POA: Diagnosis not present

## 2024-07-24 DIAGNOSIS — Z79899 Other long term (current) drug therapy: Secondary | ICD-10-CM

## 2024-07-24 DIAGNOSIS — R351 Nocturia: Secondary | ICD-10-CM

## 2024-07-24 DIAGNOSIS — R0683 Snoring: Secondary | ICD-10-CM

## 2024-07-24 DIAGNOSIS — I491 Atrial premature depolarization: Secondary | ICD-10-CM

## 2024-07-24 DIAGNOSIS — G4700 Insomnia, unspecified: Secondary | ICD-10-CM

## 2024-07-24 LAB — CBC WITH DIFFERENTIAL/PLATELET
Absolute Lymphocytes: 2419 {cells}/uL (ref 850–3900)
Absolute Monocytes: 615 {cells}/uL (ref 200–950)
Basophils Absolute: 52 {cells}/uL (ref 0–200)
Basophils Relative: 0.9 %
Eosinophils Absolute: 70 {cells}/uL (ref 15–500)
Eosinophils Relative: 1.2 %
HCT: 40.3 % (ref 35.0–45.0)
Hemoglobin: 13.1 g/dL (ref 11.7–15.5)
MCH: 30.3 pg (ref 27.0–33.0)
MCHC: 32.5 g/dL (ref 32.0–36.0)
MCV: 93.3 fL (ref 80.0–100.0)
MPV: 12.7 fL — ABNORMAL HIGH (ref 7.5–12.5)
Monocytes Relative: 10.6 %
Neutro Abs: 2645 {cells}/uL (ref 1500–7800)
Neutrophils Relative %: 45.6 %
Platelets: 289 Thousand/uL (ref 140–400)
RBC: 4.32 Million/uL (ref 3.80–5.10)
RDW: 12.5 % (ref 11.0–15.0)
Total Lymphocyte: 41.7 %
WBC: 5.8 Thousand/uL (ref 3.8–10.8)

## 2024-07-24 LAB — COMPREHENSIVE METABOLIC PANEL WITH GFR
AG Ratio: 1.9 (calc) (ref 1.0–2.5)
ALT: 22 U/L (ref 6–29)
AST: 17 U/L (ref 10–35)
Albumin: 4.6 g/dL (ref 3.6–5.1)
Alkaline phosphatase (APISO): 79 U/L (ref 37–153)
BUN: 11 mg/dL (ref 7–25)
CO2: 31 mmol/L (ref 20–32)
Calcium: 9.9 mg/dL (ref 8.6–10.4)
Chloride: 103 mmol/L (ref 98–110)
Creat: 0.84 mg/dL (ref 0.50–1.03)
Globulin: 2.4 g/dL (ref 1.9–3.7)
Glucose, Bld: 93 mg/dL (ref 65–99)
Potassium: 4.2 mmol/L (ref 3.5–5.3)
Sodium: 142 mmol/L (ref 135–146)
Total Bilirubin: 0.4 mg/dL (ref 0.2–1.2)
Total Protein: 7 g/dL (ref 6.1–8.1)
eGFR: 82 mL/min/1.73m2 (ref >=60)

## 2024-07-24 NOTE — Patient Instructions (Signed)

## 2024-07-24 NOTE — Progress Notes (Signed)
 Subjective:    Patient ID: Carly Stanley is a 53 y.o. female.  HPI    True Mar, MD, PhD [facility] [address] [address] [place], [place] [address]  Dear Dr. Francyne,  I saw your patient, Carly Stanley, upon your kind request in my sleep clinic today for initial consultation of her sleep disorder, in particular, concern for underlying obstructive sleep apnea.  The patient is accompanied by her husband today.  As you know, Carly Stanley is a 53 year old female with an underlying medical history of prediabetes, vitamin D  deficiency, basal cell cancer, edema, reflux disease, osteoarthritis, PACs, palpitations, rheumatoid arthritis, and obesity, who reports snoring and excessive daytime somnolence.  Her Epworth sleepiness score is 9 out of 24, fatigue severity score is 33 out of 63.  She lives with her husband.  She works for [employer] 24-hour on, 72-hour of rotations.  She has nocturia about once per average night and has had occasional morning headaches which are described as dull and achy.  She is a non-smoker and does not drink any alcohol and limits her caffeine to 1 or 2 cups of coffee in the morning.  She lives with her husband and 2 adult children as well as a friend/housemate.  She does not sleep through the night and does not wake up fully rested.  Bedtime is generally between 9:30 PM and 10 PM and rise time around 5 AM or 7 AM depending on her work schedule. I reviewed your office note from 06/11/2024.    Her Past Medical History Is Significant For: Past Medical History:  Diagnosis Date   BCC (basal cell carcinoma of skin) 12/26/2023   left temple, Mohs referral   Edema of both lower extremities    Encounter for general adult medical examination without abnormal findings 02/28/2024   Gallbladder problem    GERD (gastroesophageal reflux disease)    Hot flashes    Joint pain    Osteoarthritis    PAC (premature atrial contraction)    Palpitations     Pre-diabetes 2016   Rheumatoid arthritis (HCC)     Her Past Surgical History Is Significant For: Past Surgical History:  Procedure Laterality Date   BASAL CELL CARCINOMA EXCISION     CERVICAL DISC SURGERY  2016   CHOLECYSTECTOMY     OVARIAN CYST DRAINAGE      Her Family History Is Significant For: Family History  Problem Relation Age of Onset   Breast cancer Mother 13   Hyperlipidemia Mother    Hypertension Mother    Anxiety disorder Mother    Depression Mother    Hypertension Father    Stroke Father    Heart disease Father        valve replacement   Osteoarthritis Father    Hyperlipidemia Father    Cancer Father    Liver disease Father    Diabetes Brother    Cancer Maternal Grandmother        Sinus   Heart attack Paternal Grandmother    Hypertension Paternal Grandmother    Atrial fibrillation Paternal Grandmother    Lung cancer Paternal Grandfather 10   Anxiety disorder Son    ADD / ADHD Son    Asthma Son    Rheum arthritis Paternal Great-grandfather    Sleep apnea Neg Hx     Her Social History Is Significant For: Social History   Socioeconomic History   Marital status: Single    Spouse name: Not on file  Number of children: 2   Years of education: Not on file   Highest education level: Associate degree: occupational, Scientist, product/process development, or vocational program  Occupational History    Employer: [employer]    Comment: paramedic  Tobacco Use   Smoking status: Never    Passive exposure: Never   Smokeless tobacco: Never  Vaping Use   Vaping status: Never Used  Substance and Sexual Activity   Alcohol use: No   Drug use: No   Sexual activity: Yes    Birth control/protection: None  Other Topics Concern   Not on file  Social History Narrative   39 and 55 y/o boys (2024)   PT lives with family    Pt works    Social Drivers of Corporate investment banker Strain: Low Risk  (02/27/2024)   Overall Financial Resource Strain (CARDIA)    Difficulty of Paying  Living Expenses: Not hard at all  Food Insecurity: No Food Insecurity (02/27/2024)   Hunger Vital Sign    Worried About Running Out of Food in the Last Year: Never true    Ran Out of Food in the Last Year: Never true  Transportation Needs: No Transportation Needs (02/27/2024)   PRAPARE - Administrator, Civil Service (Medical): No    Lack of Transportation (Non-Medical): No  Physical Activity: Insufficiently Active (02/27/2024)   Exercise Vital Sign    Days of Exercise per Week: 2 days    Minutes of Exercise per Session: 30 min  Stress: Stress Concern Present (02/27/2024)   [employer] of Occupational Health - Occupational Stress Questionnaire    Feeling of Stress : To some extent  Social Connections: Moderately Integrated (02/27/2024)   Social Connection and Isolation Panel    Frequency of Communication with Friends and Family: More than three times a week    Frequency of Social Gatherings with Friends and Family: More than three times a week    Attends Religious Services: 1 to 4 times per year    Active Member of [employer] or Organizations: Yes    Attends Engineer, structural: More than 4 times per year    Marital Status: Divorced    Her Allergies Are:  No Known Allergies:   Her Current Medications Are:  Outpatient Encounter Medications as of 07/24/2024  Medication Sig   cetirizine (ZYRTEC) 10 MG tablet Take 10 mg by mouth daily.   citalopram  (CELEXA ) 10 MG tablet TAKE 1 TABLET BY MOUTH EVERY DAY   ibuprofen  (ADVIL ) 200 MG tablet Take by mouth as needed.   nebivolol  (BYSTOLIC ) 10 MG tablet Take 10 mg twice a day, may take extra 10 mg as needed for breakthrough palpitations   XELJANZ  XR 11 MG TB24 TAKE 1 TABLET DAILY   No facility-administered encounter medications on file as of 07/24/2024.  :   Review of Systems:  Out of a complete 14 point review of systems, all are reviewed and negative with the exception of these symptoms as listed below:   Review of  Systems  Neurological:        Pt here for sleep consult Pt snores,headaches,fatigue Pt denies hypertension,sleep study, pap machine      ESS:9 FSS:33    Objective:  Neurological Exam  Physical Exam Physical Examination:   Vitals:   07/24/24 1424  BP: 129/82  Pulse: 63    General Examination: The patient is a very pleasant 53 y.o. female in no acute distress. She appears well-developed and well-nourished and  well groomed.   HEENT: Normocephalic, atraumatic, pupils are equal, round and reactive to light, extraocular tracking is good without limitation to gaze excursion or nystagmus noted. Hearing is grossly intact. Face is symmetric with normal facial animation. Speech is clear with no dysarthria noted. There is no hypophonia. There is no lip, neck/head, jaw or voice tremor. Neck is supple with full range of passive and active motion. There are no carotid bruits on auscultation. Oropharynx exam reveals: mild mouth dryness, adequate dental hygiene and mild airway crowding, due to small airway entry, tonsils on the smaller side, Mallampati class I, minimal overbite noted, neck circumference 14 inches.  Tongue protrudes centrally and palate elevates symmetrically.  Chest: Clear to auscultation without wheezing, rhonchi or crackles noted.  Heart: S1+S2+0, regular and normal without murmurs, rubs or gallops noted.   Abdomen: Soft, non-tender and non-distended.  Extremities: There is no pitting edema in the distal lower extremities bilaterally.   Skin: Warm and dry without trophic changes noted.   Musculoskeletal: exam reveals no obvious joint deformities.   Neurologically:  Mental status: The patient is awake, alert and oriented in all 4 spheres. Her immediate and remote memory, attention, language skills and fund of knowledge are appropriate. There is no evidence of aphasia, agnosia, apraxia or anomia. Speech is clear with normal prosody and enunciation. Thought process is linear.  Mood is normal and affect is normal.  Cranial nerves II - XII are as described above under HEENT exam.  Motor exam: Normal bulk, strength and tone is noted. There is no obvious action or resting tremor.  Fine motor skills and coordination: grossly intact.  Cerebellar testing: No dysmetria or intention tremor. There is no truncal or gait ataxia.  Sensory exam: intact to light touch in the upper and lower extremities.  Gait, station and balance: She stands easily. No veering to one side is noted. No leaning to one side is noted. Posture is age-appropriate and stance is narrow based. Gait shows normal stride length and normal pace. No problems turning are noted.   Assessment and plan:   In summary, Winston Sobczyk is a very pleasant 53 y.o.-year old female with an underlying medical history of prediabetes, vitamin D  deficiency, basal cell cancer, edema, reflux disease, osteoarthritis, PACs, palpitations, rheumatoid arthritis, and obesity, whose history and physical exam are concerning for sleep disordered breathing, particularly obstructive sleep apnea (OSA). A laboratory attended sleep study is typically considered gold standard for evaluation of sleep disordered breathing.   I had a long chat with the patient and her husband about my findings and the diagnosis of sleep apnea, particularly OSA, its prognosis and treatment options. We talked about medical/conservative treatments, surgical interventions and non-pharmacological approaches for symptom control. I explained, in particular, the risks and ramifications of untreated moderate to severe OSA, especially with respect to developing cardiovascular disease down the road, including congestive heart failure (CHF), difficult to treat hypertension, cardiac arrhythmias (particularly A-fib), neurovascular complications including TIA, stroke and dementia. Even type 2 diabetes has, in part, been linked to untreated OSA. Symptoms of untreated OSA may include (but may  not be limited to) daytime sleepiness, nocturia (i.e. frequent nighttime urination), memory problems, mood irritability and suboptimally controlled or worsening mood disorder such as depression and/or anxiety, lack of energy, lack of motivation, physical discomfort, as well as recurrent headaches, especially morning or nocturnal headaches. We talked about the importance of maintaining a healthy lifestyle and striving for healthy weight. In addition, we talked about the importance of striving  for and maintaining good sleep hygiene. I recommended a sleep study at this time. I outlined the differences between a laboratory attended sleep study which is considered more comprehensive and accurate over the option of a home sleep test (HST); the latter may lead to underestimation of sleep disordered breathing in some instances and does not help with diagnosing upper airway resistance syndrome and is not accurate enough to diagnose primary central sleep apnea typically. I outlined possible surgical and non-surgical treatment options of OSA, including the use of a positive airway pressure (PAP) device (i.e. CPAP, AutoPAP/APAP or BiPAP in certain circumstances), a custom-made dental device (aka oral appliance, which would require a referral to a specialist dentist or orthodontist typically, and is generally speaking not considered for patients with full dentures or edentulous state), upper airway surgical options, such as traditional UPPP (which is not considered a first-line treatment) or the Inspire device (hypoglossal nerve stimulator, which would involve a referral for consultation with an ENT surgeon, after careful selection, following inclusion criteria - also not first-line treatment). I explained the PAP treatment option to the patient in detail, as this is generally considered first-line treatment.  The patient indicated that she would be willing to try PAP therapy, if the need arises. I explained the importance of  being compliant with PAP treatment, not only for insurance purposes but primarily to improve patient's symptoms symptoms, and for the patient's long term health benefit, including to reduce Her cardiovascular risks longer-term.    We will pick up our discussion about the next steps and treatment options after testing.  We will keep her posted as to the test results by phone call and/or MyChart messaging where possible.  We will plan to follow-up in sleep clinic accordingly as well.  I answered all their questions today and the patient and her husband were in agreement.   I encouraged them to call with any interim questions, concerns, problems or updates or email us  through MyChart.  Generally speaking, sleep test authorizations may take up to 2 weeks, sometimes less, sometimes longer, the patient is encouraged to get in touch with us  if they do not hear back from the sleep lab staff directly within the next 2 weeks.  Thank you very much for allowing me to participate in the care of this nice patient. If I can be of any further assistance to you please do not hesitate to call me at [phone].  Sincerely,   True Mar, MD, PhD

## 2024-07-25 ENCOUNTER — Ambulatory Visit: Payer: Self-pay | Admitting: Rheumatology

## 2024-07-25 NOTE — Progress Notes (Signed)
 CBC and CMP are stable.

## 2024-07-30 ENCOUNTER — Telehealth: Payer: Self-pay | Admitting: Neurology

## 2024-07-30 NOTE — Telephone Encounter (Signed)
 NPSG [employer] pending.

## 2024-07-31 ENCOUNTER — Ambulatory Visit (INDEPENDENT_AMBULATORY_CARE_PROVIDER_SITE_OTHER): Admitting: Internal Medicine

## 2024-08-01 NOTE — Telephone Encounter (Signed)
 HST [employer] no auth req NPSG denied  see below for the denial reason.

## 2024-08-15 NOTE — Progress Notes (Signed)
 Office Visit Note  Patient: Carly Stanley             Date of Birth: May 20, 1971           MRN: [id number]             PCP: Corwin Antu, FNP Referring: Corwin Antu, FNP Visit Date: 08/29/2024 Occupation: Data Unavailable  Subjective:  Pain in both hands   History of Present Illness: Carly Stanley is a 53 y.o. female with history of seropositive rheumatoid arthritis.  Patient remains on xeljanz  11 mg XR by mouth daily.  She is tolerating Xeljanz  without any side effects and has not had any gaps in therapy.  Patient reports that she has been under tremendous amount of physical and emotional stress since the end of September due to her father being diagnosed with stage IV cancer.  Patient states that she was having to lift and help maneuver her father towards the end which exacerbated the discomfort in her shoulders and hands.  Patient states that her father has passed away.  She has been taking ibuprofen  800 mg twice daily as needed for pain relief.  She continues to have discomfort in both hands which she attributes to repetitive and overuse activities.  She has noticed intermittent swelling in both hands. Patient reports that she is been experiencing increased muscle cramping at night in her lower legs. She denies any recent gaps in therapy.  She denies any recent or recurrent infections.   Activities of Daily Living:  Patient reports morning stiffness for 10-15 minutes.   Patient Reports nocturnal pain.  Difficulty dressing/grooming: Reports Difficulty climbing stairs: Reports Difficulty getting out of chair: Reports Difficulty using hands for taps, buttons, cutlery, and/or writing: Reports  Review of Systems  Constitutional:  Positive for fatigue.  HENT:  Negative for mouth sores and mouth dryness.   Eyes:  Negative for dryness.  Respiratory:  Negative for shortness of breath.   Cardiovascular:  Positive for palpitations. Negative for chest pain.  Gastrointestinal:  Negative for  blood in stool, constipation and diarrhea.  Endocrine: Negative for increased urination.  Genitourinary:  Negative for involuntary urination.  Musculoskeletal:  Positive for joint pain, joint pain, joint swelling, myalgias, muscle weakness, morning stiffness, muscle tenderness and myalgias. Negative for gait problem.  Skin:  Positive for sensitivity to sunlight. Negative for color change, rash and hair loss.  Allergic/Immunologic: Negative for susceptible to infections.  Neurological:  Positive for headaches. Negative for dizziness.  Hematological:  Negative for swollen glands.  Psychiatric/Behavioral:  Positive for sleep disturbance. Negative for depressed mood. The patient is not nervous/anxious.     PMFS History:  Patient Active Problem List   Diagnosis Date Noted   Suspected sleep apnea 05/28/2024   Elevated liver enzymes 05/28/2024   Obesity, Class II, BMI 35-39.9 02/28/2024   Screening for lipoid disorders 02/28/2024   Vasomotor symptoms due to menopause 02/28/2024   Vitamin D  deficiency 09/16/2023   Body mass index (BMI) of 33.0 to 33.9 in adult 09/16/2023   Rheumatoid arthritis involving multiple sites with positive rheumatoid factor (HCC) 12/16/2022   Palpitations 11/29/2017   PAC (premature atrial contraction) 11/29/2017   OAB (overactive bladder) 09/28/2017   Mixed incontinence 09/28/2017   Pre-diabetes 2016    Past Medical History:  Diagnosis Date   BCC (basal cell carcinoma of skin) 12/26/2023   left temple, Mohs referral   Edema of both lower extremities    Encounter for general adult medical examination without abnormal findings 02/28/2024  Gallbladder problem    GERD (gastroesophageal reflux disease)    Hot flashes    Joint pain    Osteoarthritis    PAC (premature atrial contraction)    Palpitations    Pre-diabetes 2016   Rheumatoid arthritis (HCC)     Family History  Problem Relation Age of Onset   Breast cancer Mother 44   Hyperlipidemia Mother     Hypertension Mother    Anxiety disorder Mother    Depression Mother    Hypertension Father    Stroke Father    Heart disease Father        valve replacement   Osteoarthritis Father    Hyperlipidemia Father    Cancer Father    Liver disease Father    Diabetes Brother    Cancer Maternal Grandmother        Sinus   Heart attack Paternal Grandmother    Hypertension Paternal Grandmother    Atrial fibrillation Paternal Grandmother    Lung cancer Paternal Grandfather 29   Anxiety disorder Son    ADD / ADHD Son    Asthma Son    Rheum arthritis Paternal Great-grandfather    Sleep apnea Neg Hx    Past Surgical History:  Procedure Laterality Date   BASAL CELL CARCINOMA EXCISION     CERVICAL DISC SURGERY  2016   CHOLECYSTECTOMY     OVARIAN CYST DRAINAGE     Social History   Tobacco Use   Smoking status: Never    Passive exposure: Never   Smokeless tobacco: Never  Vaping Use   Vaping status: Never Used  Substance Use Topics   Alcohol use: No   Drug use: No   Social History   Social History Narrative   35 and 55 y/o boys (2024)   PT lives with family    Pt works      Immunization History  Administered Date(s) Administered   Influenza,inj,Quad PF,6+ Mos 10/17/2020   Influenza,inj,quad, With Preservative 08/01/2020   Influenza-Unspecified 10/17/2020   Moderna Sars-Covid-2 Vaccination 10/30/2019, 11/30/2019   Td 10/09/2018   Tdap 07/11/2008     Objective: Vital Signs: BP 115/80   Pulse 61   Temp 97.6 F (36.4 C)   Resp 14   Ht 5' 2 (1.575 m)   Wt 207 lb 12.8 oz (94.3 kg)   LMP 11/10/2021 (Approximate)   BMI 38.01 kg/m    Physical Exam Vitals and nursing note reviewed.  Constitutional:      Appearance: She is well-developed.  HENT:     Head: Normocephalic and atraumatic.  Eyes:     Conjunctiva/sclera: Conjunctivae normal.  Cardiovascular:     Rate and Rhythm: Normal rate and regular rhythm.     Heart sounds: Normal heart sounds.  Pulmonary:      Effort: Pulmonary effort is normal.     Breath sounds: Normal breath sounds.  Abdominal:     General: Bowel sounds are normal.     Palpations: Abdomen is soft.  Musculoskeletal:     Cervical back: Normal range of motion.  Lymphadenopathy:     Cervical: No cervical adenopathy.  Skin:    General: Skin is warm and dry.     Capillary Refill: Capillary refill takes less than 2 seconds.  Neurological:     Mental Status: She is alert and oriented to person, place, and time.  Psychiatric:        Behavior: Behavior normal.      Musculoskeletal Exam: C-spine, thoracic spine, lumbar spine  have good range of motion.  No midline spinal tenderness.  Left trapezius muscle tension tenderness.  No SI joint tenderness.  Shoulder joints, elbow joints, wrist joints, MCPs, PIPs, DIPs have good range of motion with no synovitis.  Tenderness of all MCP and PIP joints.  Complete fist formation bilaterally.  Hip joints have good range of motion with no groin pain.  Knee joints have good range of motion no warmth or effusion.  Ankle joints have good range of motion no tenderness or joint swelling.  No evidence of Achilles tendinitis or plantar fasciitis.   CDAI Exam: CDAI Score: -- Patient Global: --; Provider Global: -- Swollen: --; Tender: -- Joint Exam 08/29/2024   No joint exam has been documented for this visit   There is currently no information documented on the homunculus. Go to the Rheumatology activity and complete the homunculus joint exam.  Investigation: No additional findings.  Imaging: No results found.  Recent Labs: Lab Results  Component Value Date   WBC 5.8 07/24/2024   HGB 13.1 07/24/2024   PLT 289 07/24/2024   NA 142 07/24/2024   K 4.2 07/24/2024   CL 103 07/24/2024   CO2 31 07/24/2024   GLUCOSE 93 07/24/2024   BUN 11 07/24/2024   CREATININE 0.84 07/24/2024   BILITOT 0.4 07/24/2024   ALKPHOS 89 05/28/2024   AST 17 07/24/2024   ALT 22 07/24/2024   PROT 7.0 07/24/2024    ALBUMIN 4.3 05/28/2024   CALCIUM 9.9 07/24/2024   GFRAA 78 10/17/2020   QFTBGOLDPLUS NEGATIVE 12/28/2023    Speciality Comments: Inadequate response to Plaquenil, Humira, Rinvoq, Orencia, Actemra, methotrexate caused nausea.  Xeljanz  5 mg p.o. twice daily since February 2024  Procedures:  No procedures performed Allergies: Patient has no known allergies.    Assessment / Plan:     Visit Diagnoses: Rheumatoid arthritis involving multiple sites with positive rheumatoid factor (HCC) - RF 87.3 and CCP >250. Dx December 2020-sx started in both hands. Under care of [facility] and [facility]: Patient presents today experiencing increased pain and stiffness involving both hands.  She is been having to perform more repetitive and overuse activities since the end of September since she was acting as the primary caregiver for her father.  She has tenderness of all MCP and PIP joints on examination.  She has been having to take ibuprofen  800 mg twice daily as needed for symptomatic relief.  She has remained on Xeljanz  11 mg XR daily.  A prednisone taper starting 20 mg tapering by 5 mg every 4 days was sent to the pharmacy today.  Advised patient to take prednisone in the morning with food and avoid the use of NSAIDs.  She will notify us  if her symptoms persist or worsen.  She will remain on Xeljanz  as monotherapy.  She will follow-up in the office in 5 months.   High risk medication use: Xeljanz  11 mg XR 1 tablet by mouth daily CBC and CMP updated on 07/24/24. Her next lab work will be due in December and every 3 months  Treatment history: - Prednisone PRN - HCQ 200 mg BID  - Methotrexate started 10/2019, did not tolerate 20 mg PO weekly due to nausea. - Humira started 01/2020. Worked for a short period, but symptoms returned, so discontinued. - Rinvoq started 08/2020 and discontinued as it did not help. - Orencia started 08/2021 and discontinued as it did not help. - Actemra started 12/2021 and discontinued as it  did not help. - Tried Orencia  again 10/2022 but discontinued as it did not help. - Xeljanz  started 12/2022 Lipid panel WNL 02/28/24  TB gold negative on 12/28/23.  No recent or recurrent infections.  Discussed the importance of holding xeljanz  if she develops signs or symptoms of an infection and to resume once the infection has completely cleared.  Patient plans on continuing to follow-up with dermatology on a yearly basis.  Discussed the increased risk for developing skin cancer in patients on Jak inhibitors.  Pain in both hands - X-rays of both hands updated on 07/07/23: mild osteoarthrosis at scattered interphalangeal joints with narrowing, sclerosis, osteophytosis and small ossicles. She presents today with increased pain and stiffness involving both hands.  She has tenderness of all MCP and PIP joints.  She has been having to perform more repetitive and overuse activities acting as a caregiver for her father.  She is having to start taking ibuprofen  800 mg twice daily for symptomatic relief. Different treatment options were discussed today.  Prednisone taper sent to the pharmacy as discussed above.  She was advised to avoid the use of NSAIDs while taking prednisone.  She will notify us  if her symptoms persist or worsen.  Trochanteric bursitis, left hip: Not currently symptomatic.   Muscle cramping: She has been experiencing muscle cramping in the lower legs at night.  Discussed the use of magnesium at bedtime.   Other medical conditions are listed as follows:   PAC (premature atrial contraction) - Holter monitor-PACs and PVCs--Taking bystolic -well-controlled.  Palpitations  OAB (overactive bladder)  Mixed incontinence  Multiple nevi  Pre-diabetes  Vitamin D  deficiency  History of shingles  Myofascial pain  Restless legs  Orders: No orders of the defined types were placed in this encounter.  Meds ordered this encounter  Medications   predniSONE (DELTASONE) 5 MG tablet     Sig: Take 4 tabs po x 4 days, 3  tabs po x 4 days, 2  tabs po x 4 days, 1  tab po x 4 days    Dispense:  40 tablet    Refill:  0    Follow-Up Instructions: Return in about 5 months (around 01/27/2025) for Rheumatoid arthritis.   Waddell CHRISTELLA Craze, PA-C  Note - This record has been created using Dragon software.  Chart creation errors have been sought, but may not always  have been located. Such creation errors do not reflect on  the standard of medical care.

## 2024-08-21 ENCOUNTER — Ambulatory Visit: Admitting: Rheumatology

## 2024-08-24 ENCOUNTER — Ambulatory Visit (INDEPENDENT_AMBULATORY_CARE_PROVIDER_SITE_OTHER): Admitting: Neurology

## 2024-08-24 DIAGNOSIS — G4733 Obstructive sleep apnea (adult) (pediatric): Secondary | ICD-10-CM | POA: Diagnosis not present

## 2024-08-24 DIAGNOSIS — G4719 Other hypersomnia: Secondary | ICD-10-CM

## 2024-08-24 DIAGNOSIS — R519 Headache, unspecified: Secondary | ICD-10-CM

## 2024-08-24 DIAGNOSIS — R0683 Snoring: Secondary | ICD-10-CM

## 2024-08-24 DIAGNOSIS — G4700 Insomnia, unspecified: Secondary | ICD-10-CM

## 2024-08-24 DIAGNOSIS — E669 Obesity, unspecified: Secondary | ICD-10-CM

## 2024-08-24 DIAGNOSIS — R351 Nocturia: Secondary | ICD-10-CM

## 2024-08-24 DIAGNOSIS — I491 Atrial premature depolarization: Secondary | ICD-10-CM

## 2024-08-24 DIAGNOSIS — Z9189 Other specified personal risk factors, not elsewhere classified: Secondary | ICD-10-CM

## 2024-08-27 ENCOUNTER — Other Ambulatory Visit: Payer: Self-pay | Admitting: Physician Assistant

## 2024-08-27 NOTE — Telephone Encounter (Signed)
 Last Fill: 07/23/2024 (30 day supply)  Labs: 07/24/2024 CBC and CMP are stable.   TB Gold: 12/28/2023 Neg    Next Visit: 08/29/2024  Last Visit: 03/15/2024  IK:Myzlfjunpi arthritis involving multiple sites with positive rheumatoid factor   Current Dose per office note 03/15/2024: Xeljanz  11 mg XR 1 tablet by mouth daily   Okay to refill Xeljanz ?

## 2024-08-29 ENCOUNTER — Ambulatory Visit: Attending: Physician Assistant | Admitting: Physician Assistant

## 2024-08-29 ENCOUNTER — Encounter: Payer: Self-pay | Admitting: Physician Assistant

## 2024-08-29 VITALS — BP 115/80 | HR 61 | Temp 97.6°F | Resp 14 | Ht 62.0 in | Wt 207.8 lb

## 2024-08-29 DIAGNOSIS — Z8619 Personal history of other infectious and parasitic diseases: Secondary | ICD-10-CM

## 2024-08-29 DIAGNOSIS — M79641 Pain in right hand: Secondary | ICD-10-CM | POA: Diagnosis not present

## 2024-08-29 DIAGNOSIS — M7918 Myalgia, other site: Secondary | ICD-10-CM

## 2024-08-29 DIAGNOSIS — R002 Palpitations: Secondary | ICD-10-CM

## 2024-08-29 DIAGNOSIS — G2581 Restless legs syndrome: Secondary | ICD-10-CM

## 2024-08-29 DIAGNOSIS — I491 Atrial premature depolarization: Secondary | ICD-10-CM

## 2024-08-29 DIAGNOSIS — M7062 Trochanteric bursitis, left hip: Secondary | ICD-10-CM

## 2024-08-29 DIAGNOSIS — E559 Vitamin D deficiency, unspecified: Secondary | ICD-10-CM

## 2024-08-29 DIAGNOSIS — N3946 Mixed incontinence: Secondary | ICD-10-CM

## 2024-08-29 DIAGNOSIS — D229 Melanocytic nevi, unspecified: Secondary | ICD-10-CM

## 2024-08-29 DIAGNOSIS — Z79899 Other long term (current) drug therapy: Secondary | ICD-10-CM | POA: Diagnosis not present

## 2024-08-29 DIAGNOSIS — M0579 Rheumatoid arthritis with rheumatoid factor of multiple sites without organ or systems involvement: Secondary | ICD-10-CM | POA: Diagnosis not present

## 2024-08-29 DIAGNOSIS — R252 Cramp and spasm: Secondary | ICD-10-CM

## 2024-08-29 DIAGNOSIS — N3281 Overactive bladder: Secondary | ICD-10-CM

## 2024-08-29 DIAGNOSIS — R7303 Prediabetes: Secondary | ICD-10-CM

## 2024-08-29 DIAGNOSIS — M79642 Pain in left hand: Secondary | ICD-10-CM

## 2024-08-29 MED ORDER — PREDNISONE 5 MG PO TABS: ORAL_TABLET | ORAL | 0 refills | Status: AC

## 2024-09-04 NOTE — Progress Notes (Unsigned)
 Carly Stanley

## 2024-09-06 ENCOUNTER — Ambulatory Visit: Payer: Self-pay | Admitting: Neurology

## 2024-09-06 NOTE — Procedures (Signed)
   [facility]  HOME SLEEP TEST (SANSA) REPORT (Mail-Out Device):   STUDY DATE: 08/26/2024  DOB: 08-11-1971  MRN: [id number]  ORDERING CLINICIAN: True Mar, MD, PhD   REFERRING CLINICIAN: Dr. Francyne  CLINICAL INFORMATION/HISTORY (obtained from visit note dated 07/24/2024): 53 year old female with an underlying medical history of prediabetes, vitamin D  deficiency, basal cell cancer, edema, reflux disease, osteoarthritis, PACs, palpitations, rheumatoid arthritis, and obesity, who reports snoring and excessive daytime somnolence.   PATIENT'S LAST REPORTED EPWORTH SLEEPINESS SCORE (ESS): 9/24.  BMI (at the time of sleep clinic visit and/or test date): 37.9 kg/m  FINDINGS:   Study Protocol:    The SANSA single-point-of-skin-contact chest-worn sensor - an FDA cleared and DOT approved type 4 home sleep test device - measures eight physiological channels,  including blood oxygen saturation (measured via PPG [photoplethysmography]), EKG-derived heart rate, respiratory effort, chest movement (measured via accelerometer), snoring, body position, and actigraphy. The device is designed to be worn for up to 10 hours per study.   Sleep Summary:   Total Recording Time (hours, min): 8 hours, 33 min  Total Effective Sleep Time (hours, min):  6 hours, 12 min  Sleep Efficiency (%):    73%   Respiratory Indices:   Calculated sAHI (per hour):  2.8/hour         Oxygen Saturation Statistics:    Oxygen Saturation (%) Mean: 93.8%   Minimum oxygen saturation (%):                 72%   O2 Saturation Range (%): 72-100%   Time below or at 88% saturation: 0 min   Pulse Rate Statistics:   Pulse Mean (bpm):    64/min    Pulse Range (55-98/min)   Snoring: Mild to loud  IMPRESSION/DIAGNOSES:   Primary snoring  RECOMMENDATIONS:   This home sleep test does not demonstrate any significant obstructive sleep disordered breathing with a total AHI of less than 5/hour.  No  significant time below or at 88% saturation was detected for the night (less than 1 minute).  Variable snoring was detected, ranging from mild to louder. Treatment with a positive airway pressure device such as AutoPap or CPAP is not indicated based on this test. Snoring may improve with avoidance of the supine sleep position and weight loss (where clinically appropriate).   For disturbing snoring, an oral appliance through dentistry or orthodontics can be considered.  Other causes of the patient's symptoms, including circadian rhythm disturbances, an underlying mood disorder, medication effect and/or an underlying medical problem cannot be ruled out based on this test. Clinical correlation is recommended.  The patient should be cautioned not to drive, work at heights, or operate dangerous or heavy equipment when tired or sleepy. Review and reiteration of good sleep hygiene measures should be pursued with any patient. The patient will be advised to follow up with her referring provider, who will be notified of the test results.   I certify that I have reviewed the raw data recording prior to the issuance of this report in accordance with the standards of the American Academy of Sleep Medicine (AASM).    INTERPRETING PHYSICIAN:   True Mar, MD, PhD Medical Director, [facility]) Diplomat, ABPN (Neurology and Sleep)   [facility] [address] [place], [place] [address] [phone]

## 2024-10-17 ENCOUNTER — Other Ambulatory Visit: Payer: Self-pay | Admitting: Family

## 2024-10-17 ENCOUNTER — Encounter: Payer: Self-pay | Admitting: Family

## 2024-10-17 DIAGNOSIS — Z800 Family history of malignant neoplasm of digestive organs: Secondary | ICD-10-CM

## 2024-10-23 ENCOUNTER — Other Ambulatory Visit: Payer: Self-pay | Admitting: Family

## 2024-10-23 DIAGNOSIS — R002 Palpitations: Secondary | ICD-10-CM

## 2024-10-23 MED ORDER — NEBIVOLOL HCL 10 MG PO TABS: ORAL_TABLET | ORAL | 1 refills | Status: AC

## 2024-10-23 NOTE — Telephone Encounter (Signed)
 Copied from CRM [id number]. Topic: Clinical - Medication Refill >> Oct 23, 2024 10:50 AM Macario HERO wrote: Medication: nebivolol  (BYSTOLIC ) 10 MG tablet [[id number]]  Has the patient contacted their pharmacy? Yes (Agent: If no, request that the patient contact the pharmacy for the refill. If patient does not wish to contact the pharmacy document the reason why and proceed with request.) (Agent: If yes, when and what did the pharmacy advise?)   This is the patient's preferred pharmacy:    [facility] [address] [facility] - [place], [place] - [address] [place] [address] Phone: [phone] Fax: [phone]   Is this the correct pharmacy for this prescription? Yes If no, delete pharmacy and type the correct one.   Has the prescription been filled recently? Yes  Is the patient out of the medication? Yes  Has the patient been seen for an appointment in the last year OR does the patient have an upcoming appointment? Yes  Can we respond through MyChart? Yes  Agent: Please be advised that Rx refills may take up to 3 business days. We ask that you follow-up with your pharmacy.

## 2024-11-06 ENCOUNTER — Inpatient Hospital Stay

## 2024-11-06 ENCOUNTER — Inpatient Hospital Stay: Attending: Oncology | Admitting: Licensed Clinical Social Worker

## 2024-11-06 ENCOUNTER — Other Ambulatory Visit: Payer: Self-pay | Admitting: Licensed Clinical Social Worker

## 2024-11-06 DIAGNOSIS — Z1379 Encounter for other screening for genetic and chromosomal anomalies: Secondary | ICD-10-CM | POA: Diagnosis not present

## 2024-11-06 DIAGNOSIS — Z803 Family history of malignant neoplasm of breast: Secondary | ICD-10-CM

## 2024-11-06 DIAGNOSIS — Z800 Family history of malignant neoplasm of digestive organs: Secondary | ICD-10-CM | POA: Diagnosis not present

## 2024-11-06 LAB — GENETIC SCREENING ORDER

## 2024-11-06 NOTE — Progress Notes (Signed)
 REFERRING PROVIDER: Corwin Antu, FNP [address] [place],  [place] [address]  PRIMARY PROVIDER:  Corwin Antu, FNP  PRIMARY REASON FOR VISIT:  1. Family history of Lynch syndrome      HISTORY OF PRESENT ILLNESS:   Carly Stanley, a 54 y.o. female, was seen for a [place] cancer genetics consultation at the request of Dr. Corwin due to a family history of cancer.  Carly Stanley presents to clinic today to discuss the possibility of a hereditary predisposition to cancer, genetic testing, and to further clarify her future cancer risks, as well as potential cancer risks for family members.   CANCER HISTORY:  Carly Stanley is a 54 y.o. female with no personal history of cancer.   RELEVANT MEDICAL HISTORY:  Menarche was at age 65.  First live birth at age 51.  Ovaries intact: yes.  Hysterectomy: no.  Menopausal status: postmenopausal.  HRT use: 0 years. Colonoscopy: yes; normal. Mammogram within the last year: yes.  Past Medical History:  Diagnosis Date   BCC (basal cell carcinoma of skin) 12/26/2023   left temple, Mohs referral   Edema of both lower extremities    Encounter for general adult medical examination without abnormal findings 02/28/2024   Gallbladder problem    GERD (gastroesophageal reflux disease)    Hot flashes    Joint pain    Osteoarthritis    PAC (premature atrial contraction)    Palpitations    Pre-diabetes 2016   Rheumatoid arthritis (HCC)     Past Surgical History:  Procedure Laterality Date   BASAL CELL CARCINOMA EXCISION     CERVICAL DISC SURGERY  2016   CHOLECYSTECTOMY     OVARIAN CYST DRAINAGE      FAMILY HISTORY:  We obtained a detailed, 4-generation family history.  Significant diagnoses are listed below: Family History  Problem Relation Age of Onset   Breast cancer Mother 64   Hyperlipidemia Mother    Hypertension Mother    Anxiety disorder Mother    Depression Mother    Hypertension Father    Stroke Father    Heart disease Father         valve replacement   Osteoarthritis Father    Hyperlipidemia Father    Cancer Father    Liver disease Father    Diabetes Brother    Cancer Maternal Grandmother        Sinus   Heart attack Paternal Grandmother    Hypertension Paternal Grandmother    Atrial fibrillation Paternal Grandmother    Lung cancer Paternal Grandfather 42   Anxiety disorder Son    ADD / ADHD Son    Asthma Son    Rheum arthritis Paternal Great-grandfather    Sleep apnea Neg Hx    Carly Stanley's mother had breast cancer at 60 and is living at 37. Maternal grandmother had sinus cavity cancer and passed over age 56.    Carly Stanley father was diagnosed with cholangiocarcinoma/bile duct cancer at 60 and passed in October. He had [employer] testing done that showed loss of MLH1/PMS2 (report available during session). He did not have hereditary cancer testing. Paternal grandfather had lung cancer. Paternal great grandmother had bone cancer.   Carly Stanley is aware of previous family history of genetic testing for hereditary cancer risks. There is no reported Ashkenazi Jewish ancestry. There is no known consanguinity.  Carly Stanley is unaware of previous family history of genetic testing for hereditary cancer risks. There is no reported Ashkenazi Jewish  ancestry. There is no known consanguinity.    GENETIC COUNSELING ASSESSMENT: Carly Stanley is a 54 y.o. female with a possible family history of Lynch syndrome in her father.  We, therefore, discussed and recommended the following at today's visit.   DISCUSSION: We discussed that approximately 10% of cancer is hereditary. We dicussed Lynch syndrome in detail, noting cancers associated and potential management changes should he have it.  It is possible that the [employer] testing reflected changes in the tumor itself and Carly Stanley's father may not have had Lynch syndrome. If Carly Stanley's father did have Lynch syndrome, Carly Stanley has a 50% chance of inheriting this condition from his father. There are other genes  associated with hereditary cancer as well. Cancers and risks are gene specific. We discussed that testing is beneficial for several reasons including knowing about cancer risks, identifying potential screening and risk-reduction options that may be appropriate, and to understand if other family members could be at risk for cancer and allow them to undergo genetic testing.    We reviewed the characteristics, features and inheritance patterns of hereditary cancer syndromes. We also discussed genetic testing, including the appropriate family members to test, the process of testing, insurance coverage and turn-around-time for results. We discussed the implications of a negative, positive and/or variant of uncertain significant result. We recommended Carly Stanley pursue genetic testing for the [facility] Common Hereditary Cancers+RNA gene panel.    The Common Hereditary Cancers Panel + RNA offered by [facility] includes sequencing and/or deletion duplication testing of the following 48 genes: APC*, ATM*, AXIN2, BAP1, BARD1, BMPR1A, BRCA1, BRCA2, BRIP1, CDH1, CDK4, CDKN2A (p14ARF), CDKN2A (p16INK4a), CHEK2, CTNNA1, DICER1*, EPCAM*, FH*, GREM1*, HOXB13, KIT, MBD4, MEN1*, MLH1*, MSH2*, MSH3*, MSH6*, MUTYH, NF1*, NTHL1, PALB2, PDGFRA, PMS2*, POLD1*, POLE, PTEN*, RAD51C, RAD51D, SDHA*, SDHB, SDHC*, SDHD, SMAD4, SMARCA4, STK11, TP53, TSC1*, TSC2, VHL.    Based on Carly Stanley's family history of possible Lynch syndrome, she meets medical criteria for genetic testing. Despite that she meets criteria, she may still have an out of pocket cost.   PLAN: After considering the risks, benefits, and limitations, Carly Stanley provided informed consent to pursue genetic testing and the blood sample was sent to [facility] for analysis of the Common Hereditary Cancers+RNA Panel. Results should be available within approximately 2-3 weeks' time, at which point they will be disclosed by telephone to Carly Stanley, as will any additional  recommendations warranted by these results. Carly Stanley will receive a summary of her genetic counseling visit and a copy of her results once available. This information will also be available in Epic.   Carly Stanley questions were answered to her satisfaction today. Our contact information was provided should additional questions or concerns arise. Thank you for the referral and allowing us  to share in the care of your patient.   Dena Cary, MS, [facility] Genetic Counselor [email or web address] Phone: [phone]   I personally spent a total of 40 minutes in the care of the patient today including getting/reviewing separately obtained history, counseling and educating, placing orders, and documenting clinical information in the EHR. Dr. Delinda was available for discussion regarding this case.   _______________________________________________________________________ For Office Staff:  Number of people involved in session: 1 Was an Intern/ student involved with case: no

## 2024-11-13 ENCOUNTER — Other Ambulatory Visit: Payer: Self-pay | Admitting: *Deleted

## 2024-11-13 ENCOUNTER — Other Ambulatory Visit: Payer: Self-pay | Admitting: Physician Assistant

## 2024-11-13 DIAGNOSIS — Z111 Encounter for screening for respiratory tuberculosis: Secondary | ICD-10-CM

## 2024-11-13 DIAGNOSIS — Z9225 Personal history of immunosupression therapy: Secondary | ICD-10-CM

## 2024-11-13 DIAGNOSIS — Z79899 Other long term (current) drug therapy: Secondary | ICD-10-CM

## 2024-11-13 NOTE — Telephone Encounter (Signed)
 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   Okay to refill Xeljanz ?

## 2024-11-14 ENCOUNTER — Ambulatory Visit: Payer: Self-pay | Admitting: Rheumatology

## 2024-11-15 LAB — QUANTIFERON-TB GOLD PLUS
Mitogen-NIL: >10 [IU]/mL
NIL: 0.01 [IU]/mL
QuantiFERON-TB Gold Plus: NEGATIVE
TB1-NIL: 0 [IU]/mL
TB2-NIL: 0 [IU]/mL

## 2024-11-15 LAB — COMPREHENSIVE METABOLIC PANEL WITH GFR
AG Ratio: 1.6 (calc) (ref 1.0–2.5)
ALT: 23 U/L (ref 6–29)
AST: 16 U/L (ref 10–35)
Albumin: 4.2 g/dL (ref 3.6–5.1)
Alkaline phosphatase (APISO): 67 U/L (ref 37–153)
BUN: 12 mg/dL (ref 7–25)
CO2: 28 mmol/L (ref 20–32)
Calcium: 9.8 mg/dL (ref 8.6–10.4)
Chloride: 103 mmol/L (ref 98–110)
Creat: 0.83 mg/dL (ref 0.50–1.03)
Globulin: 2.7 g/dL (ref 1.9–3.7)
Glucose, Bld: 95 mg/dL (ref 65–99)
Potassium: 4.7 mmol/L (ref 3.5–5.3)
Sodium: 139 mmol/L (ref 135–146)
Total Bilirubin: 0.4 mg/dL (ref 0.2–1.2)
Total Protein: 6.9 g/dL (ref 6.1–8.1)
eGFR: 83 mL/min/1.73m2

## 2024-11-15 LAB — CBC WITH DIFFERENTIAL/PLATELET
Absolute Lymphocytes: 1380 {cells}/uL (ref 850–3900)
Absolute Monocytes: 814 {cells}/uL (ref 200–950)
Basophils Absolute: 62 {cells}/uL (ref 0–200)
Basophils Relative: 0.9 %
Eosinophils Absolute: 159 {cells}/uL (ref 15–500)
Eosinophils Relative: 2.3 %
HCT: 37.9 % (ref 35.9–46.0)
Hemoglobin: 12.6 g/dL (ref 11.7–15.5)
MCH: 30.1 pg (ref 27.0–33.0)
MCHC: 33.2 g/dL (ref 31.6–35.4)
MCV: 90.5 fL (ref 81.4–101.7)
MPV: 11.9 fL (ref 7.5–12.5)
Monocytes Relative: 11.8 %
Neutro Abs: 4485 {cells}/uL (ref 1500–7800)
Neutrophils Relative %: 65 %
Platelets: 313 Thousand/uL (ref 140–400)
RBC: 4.19 Million/uL (ref 3.80–5.10)
RDW: 12.8 % (ref 11.0–15.0)
Total Lymphocyte: 20 %
WBC: 6.9 Thousand/uL (ref 3.8–10.8)

## 2024-11-20 ENCOUNTER — Telehealth: Payer: Self-pay | Admitting: Licensed Clinical Social Worker

## 2024-11-21 ENCOUNTER — Encounter: Payer: Self-pay | Admitting: Licensed Clinical Social Worker

## 2024-11-21 ENCOUNTER — Ambulatory Visit: Payer: Self-pay | Admitting: Licensed Clinical Social Worker

## 2024-11-21 DIAGNOSIS — Z1379 Encounter for other screening for genetic and chromosomal anomalies: Secondary | ICD-10-CM | POA: Insufficient documentation

## 2024-11-21 NOTE — Progress Notes (Signed)
 HPI:   Ms. Mcmeans was previously seen in the [place] [place] [facility] clinic due to a family history of cancer and concerns regarding a hereditary predisposition to cancer. Please refer to our prior cancer genetics clinic note for more information regarding our discussion, assessment and recommendations, at the time. Ms. Nakata recent genetic test results were disclosed to her, as were recommendations warranted by these results. These results and recommendations are discussed in more detail below.  CANCER HISTORY:  Oncology History   No problem history exists.    FAMILY HISTORY:  We obtained a detailed, 4-generation family history.  Significant diagnoses are listed below: Family History  Problem Relation Age of Onset   Breast cancer Mother 51   Hyperlipidemia Mother    Hypertension Mother    Anxiety disorder Mother    Depression Mother    Hypertension Father    Stroke Father    Heart disease Father        valve replacement   Osteoarthritis Father    Hyperlipidemia Father    Cancer Father    Liver disease Father    Diabetes Brother    Cancer Maternal Grandmother        Sinus   Heart attack Paternal Grandmother    Hypertension Paternal Grandmother    Atrial fibrillation Paternal Grandmother    Lung cancer Paternal Grandfather 30   Anxiety disorder Son    ADD / ADHD Son    Asthma Son    Rheum arthritis Paternal Great-grandfather    Sleep apnea Neg Hx    Ms. Escareno's mother had breast cancer at 71 and is living at 65. Maternal grandmother had sinus cavity cancer and passed over age 71.    Ms. Rosiles father was diagnosed with cholangiocarcinoma/bile duct cancer at 43 and passed in October. He had [employer] testing done that showed loss of MLH1/PMS2 (report available during session). He did not have hereditary cancer testing. Paternal grandfather had lung cancer. Paternal great grandmother had bone cancer.   Ms. Thorington is aware of previous family history of genetic testing for hereditary  cancer risks. There is no reported Ashkenazi Jewish ancestry. There is no known consanguinity.   Ms. Gebert is unaware of previous family history of genetic testing for hereditary cancer risks. There is no reported Ashkenazi Jewish ancestry. There is no known consanguinity.     GENETIC TEST RESULTS:  The [facility] Multi-Cancer+RNA Panel found no pathogenic mutations.   The Multi-Cancer + RNA Panel offered by [facility] includes sequencing and/or deletion/duplication analysis of the following 70 genes:  AIP*, ALK, APC*, ATM*, AXIN2*, BAP1*, BARD1*, BLM*, BMPR1A*, BRCA1*, BRCA2*, BRIP1*, CDC73*, CDH1*, CDK4, CDKN1B*, CDKN2A, CHEK2*, CTNNA1*, DICER1*, EPCAM, EGFR, FH*, FLCN*, GREM1, HOXB13, KIT, LZTR1, MAX*, MBD4, MEN1*, MET, MITF, MLH1*, MSH2*, MSH3*, MSH6*, MUTYH*, NF1*, NF2*, NTHL1*, PALB2*, PDGFRA, PMS2*, POLD1*, POLE*, POT1*, PRKAR1A*, PTCH1*, PTEN*, RAD51C*, RAD51D*, RB1*, RET, SDHA*, SDHAF2*, SDHB*, SDHC*, SDHD*, SMAD4*, SMARCA4*, SMARCB1*, SMARCE1*, STK11*, SUFU*, TMEM127*, TP53*, TSC1*, TSC2*, VHL*. RNA analysis is performed for * genes.   The test report has been scanned into EPIC and is located under the Molecular Pathology section of the Results Review tab.  A portion of the result report is included below for reference. Genetic testing reported out on 11/21/2024.      Even though a pathogenic variant was not identified, possible explanations for the cancer in the family may include: There may be no hereditary risk for cancer in the family. The cancers in Ms. Montufar and/or her family may be  sporadic/familial or due to other genetic and environmental factors. There may be a gene mutation in one of these genes that current testing methods cannot detect but that chance is small. There could be another gene that has not yet been discovered, or that we have not yet tested, that is responsible for the cancer diagnoses in the family.  It is also possible there is a hereditary cause for the cancer in the  family that Ms. Adeyemi did not inherit.  Therefore, it is important to remain in touch with cancer genetics in the future so that we can continue to offer Ms. Selover the most up to date genetic testing.   ADDITIONAL GENETIC TESTING:  We discussed with Ms. Balgobin that her genetic testing was fairly extensive.  If there are additional relevant genes identified to increase cancer risk that can be analyzed in the future, we would be happy to discuss and coordinate this testing at that time.    CANCER SCREENING RECOMMENDATIONS:  Ms. Windish test result is considered negative (normal).  This means that we have not identified a hereditary cause for her family history of cancer at this time.   An individual's cancer risk and medical management are not determined by genetic test results alone. Overall cancer risk assessment incorporates additional factors, including personal medical history, family history, and any available genetic information that may result in a personalized plan for cancer prevention and surveillance. Therefore, it is recommended she continue to follow the cancer management and screening guidelines provided by her  primary healthcare provider.  RECOMMENDATIONS FOR FAMILY MEMBERS:   Since she did not inherit a identifiable mutation in a cancer predisposition gene included on this panel, her children could not have inherited a known mutation from her in one of these genes. Individuals in this family might be at some increased risk of developing cancer, over the general population risk, due to the family history of cancer.  Individuals in the family should notify their providers of the family history of cancer. We recommend women in this family have a yearly mammogram beginning at age 32, or 72 years younger than the earliest onset of cancer, an annual clinical breast exam, and perform monthly breast self-exams.  Family members should have colonoscopies by at age 63, or earlier, as recommended by their  providers. Other members of the family may still carry a pathogenic variant in one of these genes that Ms. Selk did not inherit. Based on the family history, we recommend her paternal relatives have genetic counseling and testing. Ms. Reither will let us  know if we can be of any assistance in coordinating genetic counseling and/or testing for this family member.     FOLLOW-UP:  Lastly, we discussed with Ms. Mikowski that cancer genetics is a rapidly advancing field and it is possible that new genetic tests will be appropriate for her and/or her family members in the future. We encouraged her to remain in contact with cancer genetics on an annual basis so we can update her personal and family histories and let her know of advances in cancer genetics that may benefit this family.   Our contact number was provided. Ms. Mudrick questions were answered to her satisfaction, and she knows she is welcome to call us  at anytime with additional questions or concerns.    Dena Cary, MS, [facility] Genetic Counselor [email or web address] Phone: [phone]

## 2024-11-21 NOTE — Telephone Encounter (Signed)
 I contacted Ms. Vereen to discuss her genetic testing results. No pathogenic variants were identified in the 70 genes analyzed. Detailed clinic note to follow.   The test report has been scanned into EPIC and is located under the Molecular Pathology section of the Results Review tab.  A portion of the result report is included below for reference.      Dena Cary, MS, [facility] Genetic Counselor [email or web address] Phone: [phone]

## 2024-12-27 ENCOUNTER — Ambulatory Visit: Payer: Managed Care, Other (non HMO) | Admitting: Dermatology

## 2025-01-28 ENCOUNTER — Ambulatory Visit: Admitting: Physician Assistant
